# Patient Record
Sex: Female | Born: 1980 | Race: White | Hispanic: No | Marital: Single | Smoking: Former smoker
Health system: Southern US, Community
[De-identification: ages and names within clinical notes are randomized; demographics above are authoritative.]

## PROBLEM LIST (undated history)

## (undated) DIAGNOSIS — O4290 Premature rupture of membranes, unspecified as to length of time between rupture and onset of labor, unspecified weeks of gestation: Secondary | ICD-10-CM

## (undated) DIAGNOSIS — F319 Bipolar disorder, unspecified: Secondary | ICD-10-CM

## (undated) DIAGNOSIS — F329 Major depressive disorder, single episode, unspecified: Secondary | ICD-10-CM

## (undated) DIAGNOSIS — F32A Depression, unspecified: Secondary | ICD-10-CM

## (undated) DIAGNOSIS — K8020 Calculus of gallbladder without cholecystitis without obstruction: Secondary | ICD-10-CM

## (undated) HISTORY — PX: UPPER GI ENDOSCOPY: SHX6162

## (undated) HISTORY — DX: Bipolar disorder, unspecified: F31.9

## (undated) HISTORY — DX: Major depressive disorder, single episode, unspecified: F32.9

## (undated) HISTORY — DX: Depression, unspecified: F32.A

---
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
## 2000-07-03 ENCOUNTER — Other Ambulatory Visit: Admission: RE | Admit: 2000-07-03 | Discharge: 2000-07-03 | Payer: Self-pay | Admitting: Gynecology

## 2001-07-11 ENCOUNTER — Other Ambulatory Visit: Admission: RE | Admit: 2001-07-11 | Discharge: 2001-07-11 | Payer: Self-pay | Admitting: Gynecology

## 2002-10-07 ENCOUNTER — Other Ambulatory Visit: Admission: RE | Admit: 2002-10-07 | Discharge: 2002-10-07 | Payer: Self-pay | Admitting: Gynecology

## 2004-03-01 ENCOUNTER — Other Ambulatory Visit: Admission: RE | Admit: 2004-03-01 | Discharge: 2004-03-01 | Payer: Self-pay | Admitting: Gynecology

## 2005-03-15 ENCOUNTER — Other Ambulatory Visit: Admission: RE | Admit: 2005-03-15 | Discharge: 2005-03-15 | Payer: Self-pay | Admitting: Gynecology

## 2006-04-05 ENCOUNTER — Other Ambulatory Visit: Admission: RE | Admit: 2006-04-05 | Discharge: 2006-04-05 | Payer: Self-pay | Admitting: Gynecology

## 2009-08-06 ENCOUNTER — Emergency Department (HOSPITAL_COMMUNITY): Admission: EM | Admit: 2009-08-06 | Discharge: 2009-08-07 | Payer: Self-pay | Admitting: Emergency Medicine

## 2009-08-07 ENCOUNTER — Ambulatory Visit: Payer: Self-pay | Admitting: Psychiatry

## 2009-08-07 ENCOUNTER — Inpatient Hospital Stay (HOSPITAL_COMMUNITY): Admission: AD | Admit: 2009-08-07 | Discharge: 2009-08-18 | Payer: Self-pay | Admitting: Psychiatry

## 2010-04-10 LAB — HEPATIC FUNCTION PANEL
Albumin: 4.1 g/dL (ref 3.5–5.2)
Alkaline Phosphatase: 59 U/L (ref 39–117)
Indirect Bilirubin: 0.3 mg/dL (ref 0.3–0.9)
Total Protein: 6.7 g/dL (ref 6.0–8.3)

## 2010-04-10 LAB — CARBAMAZEPINE, FREE AND TOTAL
Carbamazepine Metabolite/: 3.3 ug/mL
Carbamazepine Metabolite: 2.4 ug/mL — ABNORMAL HIGH (ref 0.1–1.0)
Carbamazepine, Bound: 6.2 ug/mL
Carbamazepine, Free: 1.7 ug/mL (ref 1.0–3.0)

## 2010-04-10 LAB — CARBAMAZEPINE LEVEL, TOTAL: Carbamazepine Lvl: 12.9 ug/mL — ABNORMAL HIGH (ref 4.0–12.0)

## 2010-04-10 LAB — LIPID PANEL
HDL: 50 mg/dL (ref >39)
Triglycerides: 105 mg/dL (ref <150)
VLDL: 21 mg/dL (ref 0–40)

## 2010-04-11 LAB — DIFFERENTIAL
Basophils Relative: 3 % — ABNORMAL HIGH (ref 0–1)
Eosinophils Absolute: 0.1 10*3/uL (ref 0.0–0.7)
Lymphs Abs: 2.6 10*3/uL (ref 0.7–4.0)
Monocytes Absolute: 0.9 10*3/uL (ref 0.1–1.0)
Monocytes Relative: 10 % (ref 3–12)
Neutrophils Relative %: 57 % (ref 43–77)

## 2010-04-11 LAB — POCT I-STAT, CHEM 8
BUN: 11 mg/dL (ref 6–23)
Creatinine, Ser: 0.8 mg/dL (ref 0.4–1.2)
Glucose, Bld: 95 mg/dL (ref 70–99)
Sodium: 140 mEq/L (ref 135–145)
TCO2: 25 mmol/L (ref 0–100)

## 2010-04-11 LAB — CBC
HCT: 41 % (ref 36.0–46.0)
Hemoglobin: 14.1 g/dL (ref 12.0–15.0)
MCH: 33.8 pg (ref 26.0–34.0)
MCHC: 34.5 g/dL (ref 30.0–36.0)
MCV: 98 fL (ref 78.0–100.0)
RBC: 4.19 MIL/uL (ref 3.87–5.11)

## 2010-04-11 LAB — RAPID URINE DRUG SCREEN, HOSP PERFORMED
Barbiturates: NOT DETECTED
Benzodiazepines: POSITIVE — AB
Cocaine: NOT DETECTED
Opiates: NOT DETECTED

## 2013-01-24 NOTE — L&D Delivery Note (Signed)
Delivery Note At 2:47 PM a viable and healthy female was delivered via Vaginal, Spontaneous Delivery (Presentation: OA, LOT ).  APGAR:8,9 ; weight P  .   Placenta status: delivered intact.  Cord: 3VC with the following complications: nuchal.   Anesthesia: Epidural  Episiotomy: none Lacerations: 1st degree perineal and r labial Suture Repair: 3.0 vicryl rapide Est. Blood Loss (mL): 450cc, some atomy - bimanual massage and cytotec 800mcg PR  Mom to postpartum.  Baby to Couplet care / Skin to Skin.  Bovard-Stuckert, Jody 07/17/2013, 3:05 PM  Br/A+/RI/Mirena for contra

## 2013-02-20 LAB — OB RESULTS CONSOLE ANTIBODY SCREEN: ANTIBODY SCREEN: NEGATIVE

## 2013-02-20 LAB — OB RESULTS CONSOLE ABO/RH: RH TYPE: POSITIVE

## 2013-02-20 LAB — OB RESULTS CONSOLE RUBELLA ANTIBODY, IGM: RUBELLA: IMMUNE

## 2013-02-20 LAB — OB RESULTS CONSOLE HIV ANTIBODY (ROUTINE TESTING): HIV: NONREACTIVE

## 2013-02-20 LAB — OB RESULTS CONSOLE GC/CHLAMYDIA
Chlamydia: NEGATIVE
Gonorrhea: NEGATIVE

## 2013-02-20 LAB — OB RESULTS CONSOLE RPR: RPR: NONREACTIVE

## 2013-02-20 LAB — OB RESULTS CONSOLE HEPATITIS B SURFACE ANTIGEN: HEP B S AG: NEGATIVE

## 2013-05-22 ENCOUNTER — Telehealth: Payer: Self-pay | Admitting: Internal Medicine

## 2013-05-22 NOTE — Telephone Encounter (Signed)
S/W PATIENT AND GVE NP APPT FOR 05/05 @ 10:30 W/DR. CHISM REFERRING DR. TODD MEISINGER DX- WBC ELEVATED WELCOME PACKET MAILED.

## 2013-05-22 NOTE — Telephone Encounter (Signed)
C/D 05/22/13 for appt. 05/28/13

## 2013-05-28 ENCOUNTER — Encounter: Payer: Self-pay | Admitting: Internal Medicine

## 2013-05-28 ENCOUNTER — Ambulatory Visit: Payer: BC Managed Care – PPO

## 2013-05-28 ENCOUNTER — Telehealth: Payer: Self-pay | Admitting: Internal Medicine

## 2013-05-28 ENCOUNTER — Other Ambulatory Visit: Payer: Self-pay | Admitting: Internal Medicine

## 2013-05-28 ENCOUNTER — Encounter (INDEPENDENT_AMBULATORY_CARE_PROVIDER_SITE_OTHER): Payer: Self-pay

## 2013-05-28 ENCOUNTER — Other Ambulatory Visit (HOSPITAL_BASED_OUTPATIENT_CLINIC_OR_DEPARTMENT_OTHER): Payer: BC Managed Care – PPO

## 2013-05-28 ENCOUNTER — Ambulatory Visit (HOSPITAL_BASED_OUTPATIENT_CLINIC_OR_DEPARTMENT_OTHER): Payer: BC Managed Care – PPO | Admitting: Internal Medicine

## 2013-05-28 VITALS — BP 121/75 | HR 97 | Temp 98.3°F | Resp 18 | Wt 167.3 lb

## 2013-05-28 DIAGNOSIS — D72829 Elevated white blood cell count, unspecified: Secondary | ICD-10-CM

## 2013-05-28 DIAGNOSIS — F17200 Nicotine dependence, unspecified, uncomplicated: Secondary | ICD-10-CM

## 2013-05-28 DIAGNOSIS — Z331 Pregnant state, incidental: Secondary | ICD-10-CM

## 2013-05-28 DIAGNOSIS — D649 Anemia, unspecified: Secondary | ICD-10-CM

## 2013-05-28 DIAGNOSIS — E876 Hypokalemia: Secondary | ICD-10-CM

## 2013-05-28 LAB — CBC WITH DIFFERENTIAL/PLATELET
BASO%: 0.2 % (ref 0.0–2.0)
Basophils Absolute: 0 10*3/uL (ref 0.0–0.1)
EOS%: 0.9 % (ref 0.0–7.0)
Eosinophils Absolute: 0.2 10*3/uL (ref 0.0–0.5)
HEMATOCRIT: 33.6 % — AB (ref 34.8–46.6)
HGB: 11.2 g/dL — ABNORMAL LOW (ref 11.6–15.9)
LYMPH#: 2.4 10*3/uL (ref 0.9–3.3)
LYMPH%: 12.9 % — ABNORMAL LOW (ref 14.0–49.7)
MCH: 30.8 pg (ref 25.1–34.0)
MCHC: 33.3 g/dL (ref 31.5–36.0)
MCV: 92.3 fL (ref 79.5–101.0)
MONO#: 1.5 10*3/uL — AB (ref 0.1–0.9)
MONO%: 8 % (ref 0.0–14.0)
NEUT#: 14.5 10*3/uL — ABNORMAL HIGH (ref 1.5–6.5)
NEUT%: 78 % — AB (ref 38.4–76.8)
Platelets: 322 10*3/uL (ref 145–400)
RBC: 3.64 10*6/uL — ABNORMAL LOW (ref 3.70–5.45)
RDW: 13.6 % (ref 11.2–14.5)
WBC: 18.5 10*3/uL — AB (ref 3.9–10.3)

## 2013-05-28 LAB — COMPREHENSIVE METABOLIC PANEL (CC13)
ALBUMIN: 2.5 g/dL — AB (ref 3.5–5.0)
ALK PHOS: 119 U/L (ref 40–150)
ALT: 9 U/L (ref 0–55)
AST: 11 U/L (ref 5–34)
Anion Gap: 8 mEq/L (ref 3–11)
BUN: 3.9 mg/dL — AB (ref 7.0–26.0)
CALCIUM: 9.7 mg/dL (ref 8.4–10.4)
CHLORIDE: 112 meq/L — AB (ref 98–109)
CO2: 21 mEq/L — ABNORMAL LOW (ref 22–29)
Creatinine: 0.6 mg/dL (ref 0.6–1.1)
Glucose: 109 mg/dl (ref 70–140)
POTASSIUM: 3.4 meq/L — AB (ref 3.5–5.1)
SODIUM: 141 meq/L (ref 136–145)
TOTAL PROTEIN: 5.7 g/dL — AB (ref 6.4–8.3)
Total Bilirubin: <0.2 mg/dL (ref 0.20–1.20)

## 2013-05-28 LAB — CHCC SMEAR

## 2013-05-28 LAB — LACTATE DEHYDROGENASE (CC13): LDH: 159 U/L (ref 125–245)

## 2013-05-28 NOTE — Progress Notes (Signed)
Checked in new patient with no financial issues. She has not been out of country and didn't have her ncdl or id. Will bring next time. She said was denied medicaid.

## 2013-05-28 NOTE — Telephone Encounter (Signed)
gave pt appt for labs for june and July 2015 lab and MD

## 2013-05-28 NOTE — Progress Notes (Signed)
[facility] [phone]   [phone]  NEW PATIENT EVALUATION   Name: Stephanie Schroeder Date: 05/29/2013 MRN: [id number] DOB: 07/16/1980  PCP: No PCP Per Patient   REFERRING PHYSICIAN: Meisinger, Todd, MD  REASON FOR REFERRAL: Leukocytosis NOS   HISTORY OF PRESENT ILLNESS:Stephanie Schroeder is a 33 y.o. female with a history of bipolar disorder, depressive disorder who is [redacted] weeks gestation is being referred for further evaluation of her leukocytosis by Dr. Lavina Hammanodd Meisinger.  This is her first pregnancy.  She reports good prenatal visits (monthly).  She notes persistence in her elevated WBCs. Her CBC on 05/16/2013 revealed a WBC of 23.6 with elevated neutrophils (absolute) of 18.8; lymphs of 3.3 and monocytes of 1.2.  Her hemoglobin was 11.1 and hematocrit was 32.5 .    In August 06, 2009, she was [facility] her WBC of 9.2.  They first checked her complete blood count in December.  She reported being told she had an dental infection in January 2015 by Dr. Beckie SaltsHobbs Turner office. She notes that she will require root canal therapy.  She reports a history of wisdom tooth on the left upper and lower with removal in November, 2014.  She reported being treated for a dental infections following these extractions.  She denies any steroid use.   She denies any new medications.  She remains of Stephanie Schroeder daily Since her second trimester.  She was on it previously but took herself off this medication a couple years ago. She denies fevers or chills.  She denies urinary symptoms such as burning with urination.  She is compliant with prenatal vitamins.  Her estimated date of delivery  Is July 3rd 2015.  She denies bleeding problems or blood transfusions.  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  Her remaining paps have been normal.  Her weight started at 133 lbs and she weighs 167 today. She quit smoking for the most part with  occasional cigarettes doing pregnancy.    PAST MEDICAL HISTORY:  has a past medical history of Bipolar 1 disorder and Depression.     PAST SURGICAL HISTORY:No past surgical history on file.   CURRENT MEDICATIONS: has a current medication list which includes the following prescription(s): ondansetron, prenatal vit-fe fumarate-fa, and venlafaxine xr.   ALLERGIES: Risperidone and related   SOCIAL HISTORY:  reports that she has been smoking.  She does not have any smokeless tobacco history on file. She reports that she does not use illicit drugs.   FAMILY HISTORY: She denies a family history of hematological problems.   LABORATORY DATA:  Results for orders placed in visit on 05/28/13 (from the past 48 hour(s))  CBC WITH DIFFERENTIAL     Status: Abnormal   Collection Time    05/28/13 10:38 AM      Result Value Ref Range   WBC 18.5 (*) 3.9 - 10.3 10e3/uL   NEUT# 14.5 (*) 1.5 - 6.5 10e3/uL   HGB 11.2 (*) 11.6 - 15.9 g/dL   HCT 29.533.6 (*) 62.134.8 - 30.846.6 %   Platelets 322  145 - 400 10e3/uL   MCV 92.3  79.5 - 101.0 fL   MCH 30.8  25.1 - 34.0 pg   MCHC 33.3  31.5 - 36.0 g/dL   RBC 6.573.64 (*) 8.463.70 - 9.625.45 10e6/uL   RDW 13.6  11.2 - 14.5 %   lymph# 2.4  0.9 - 3.3 10e3/uL  MONO# 1.5 (*) 0.1 - 0.9 10e3/uL   Eosinophils Absolute 0.2  0.0 - 0.5 10e3/uL   Basophils Absolute 0.0  0.0 - 0.1 10e3/uL   NEUT% 78.0 (*) 38.4 - 76.8 %   LYMPH% 12.9 (*) 14.0 - 49.7 %   MONO% 8.0  0.0 - 14.0 %   EOS% 0.9  0.0 - 7.0 %   BASO% 0.2  0.0 - 2.0 %  [facility] SMEAR     Status: None   Collection Time    05/28/13 10:38 AM      Result Value Ref Range   Smear Result Smear Available    LACTATE DEHYDROGENASE (CC13)     Status: None   Collection Time    05/28/13 10:38 AM      Result Value Ref Range   LDH 159  125 - 245 U/L  COMPREHENSIVE METABOLIC PANEL (CC13)     Status: Abnormal   Collection Time    05/28/13 10:38 AM      Result Value Ref Range   Sodium 141  136 - 145 mEq/L   Potassium 3.4 (*) 3.5 - 5.1  mEq/L   Chloride 112 (*) 98 - 109 mEq/L   CO2 21 (*) 22 - 29 mEq/L   Glucose 109  70 - 140 Schroeder/dl   BUN 3.9 (*) 7.0 - 16.1 Schroeder/dL   Creatinine 0.6  0.6 - 1.1 Schroeder/dL   Total Bilirubin <0.96  0.20 - 1.20 Schroeder/dL   Alkaline Phosphatase 119  40 - 150 U/L   AST 11  5 - 34 U/L   ALT 9  0 - 55 U/L   Total Protein 5.7 (*) 6.4 - 8.3 g/dL   Albumin 2.5 (*) 3.5 - 5.0 g/dL   Calcium 9.7  8.4 - 04.5 Schroeder/dL   Anion Gap 8  3 - 11 mEq/L       RADIOGRAPHY: No results found.     REVIEW OF SYSTEMS:  Constitutional: Denies fevers, chills or abnormal weight loss Eyes: Denies blurriness of vision Ears, nose, mouth, throat, and face: Denies mucositis or sore throat Respiratory: Denies cough, dyspnea or wheezes Cardiovascular: Denies palpitation, chest discomfort or lower extremity swelling Gastrointestinal:  Denies nausea, heartburn or change in bowel habits Skin: Denies abnormal skin rashes Lymphatics: Denies new lymphadenopathy or easy bruising Neurological:Denies numbness, tingling or new weaknesses Behavioral/Psych: Mood is stable, no new changes  All other systems were reviewed with the patient and are negative.  PHYSICAL EXAM:  weight is 167 lb 4.8 oz (75.887 kg). Her oral temperature is 98.3 F (36.8 C). Her blood pressure is 121/75 and her pulse is 97. Her respiration is 18.    GENERAL:alert, no distress and comfortable; well developed and well nourished.  SKIN: skin color, texture, turgor are normal, no rashes or significant lesions EYES: normal, Conjunctiva are pink and non-injected, sclera clear OROPHARYNX:no exudate, no erythema and lips, buccal mucosa, and tongue normal  NECK: supple, thyroid normal size, non-tender, without nodularity LYMPH:  no palpable lymphadenopathy in the cervical, axillary or inguinal LUNGS: clear to auscultation and percussion with normal breathing effort HEART: regular rate & rhythm and no murmurs and no lower extremity edema ABDOMEN:abdomen soft, non-tender and  normal bowel sounds; gestation about 31 weeks.  Musculoskeletal:no cyanosis of digits and no clubbing; L arm brace for carpal tunnel.  NEURO: alert & oriented x 3 with fluent speech, no focal motor/sensory deficits   IMPRESSION: Yasmyn Bellisario is a 33 y.o. female with a history of  PLAN: 1.  Leukocytosis NOS. --We discussed common causes of elevated wbcs including infection, inflammation, medication-induced, i.e., steroids, versus smoking and pregnancy.  She denies fevers or receipt of antibiotics suggestive of infectious etiology.  She denies cough, dysuria.   She reports a normal UA recently from her primary care physician's office. She does have carpal tunnel that is mild.  She denies steroid use presently.  Therefore, pregnancy should be considered.   It has been associated with leukocytosis, primarily related to increased circulation of neutrophils.  Her neutrophil count is elevated as noted above.   The neutrophil count begins to increase in the second month of pregnancy and plateaus in the second or third trimester, at which time the total white blood cell counts range from 9000 to 15,000 cells/microL.  The white blood cell count falls to the normal nonpregnant range by the sixth day postpartum.  We will examine her smear.  We will repeat her CBC in one month to establish trend and one month post-partum.  If it remains elevated, we will send for additional testing. We provided a handout on leukocytosis.   2. Tobacco abuse. --We strongly advised again any smoking while pregnant.  She voiced understanding. Smoking, especially heavy smoking, can cause mild elevation in leukocytes as well.   3. Mild anemia, normocytic.  --Her anemia appears to be normocytic and likely secondary to pregnancy.  We will repeat labs in one month and counseled her to continue her prenatal vitamins.  4. Pregnancy. --She is at [redacted] weeks gestation with EDD in July.   This is here first pregnancy.   5. Hypokalemia.    --Potassium 3.4 today.  We counseled the patient to increase potassium enriched foods.  She was asymptomatic for symptoms of hypokalemia.   6. Follow-up. --Labs in one month and follow up visit several weeks post-delivery.   All questions were answered. The patient knows to call the clinic with any problems, questions or concerns. We can certainly see the patient much sooner if necessary.  I spent 30 minutes counseling the patient face to face. The total time spent in the appointment was 45 minutes.    Myra Rudeavid Bryella Diviney, MD 05/29/2013 9:22 AM

## 2013-05-28 NOTE — Progress Notes (Signed)
Patient didn't know she had medicaid. I verified via passport.

## 2013-05-28 NOTE — Patient Instructions (Signed)
Leukocytosis Leukocytosis means you have more white blood cells than normal. White blood cells are made in your bone marrow. The main job of white blood cells is to fight infection. Having too many white blood cells is a common condition. It can develop as a result of many types of medical problems. CAUSES  In some cases, your bone marrow may be normal, but it is still making too many white blood cells. This could be the result of:  Infection.  Injury.  Physical stress.  Emotional stress.  Surgery.  Allergic reactions.  Tumors that do not start in the blood or bone marrow.  An inherited disease.  Certain medicines.  Pregnancy and labor. In other cases, you may have a bone marrow disorder that is causing your body to make too many white blood cells. Bone marrow disorders include:  Leukemia. This is a type of blood cancer.  Myeloproliferative disorders. These disorders cause blood cells to grow abnormally. SYMPTOMS  Some people have no symptoms. Others have symptoms due to the medical problem that is causing their leukocytosis. These symptoms may include:  Bleeding.  Bruising.  Fever.  Night sweats.  Repeated infections.  Weakness.  Weight loss. DIAGNOSIS  Leukocytosis is often found during blood tests that are done as part of a normal physical exam. Your caregiver will probably order other tests to help determine why you have too many white blood cells. These tests may include:  A complete blood count (CBC). This test measures all the types of blood cells in your body.  Chest X-rays, urine tests (urinalysis), or other tests to look for signs of infection.  Bone marrow aspiration. For this test, a needle is put into your bone. Cells from the bone marrow are removed through the needle. The cells are then examined under a microscope. TREATMENT  Treatment is usually not needed for leukocytosis. However, if a disorder is causing your leukocytosis, it will need to be  treated. Treatment may include:  Antibiotic medicines if you have a bacterial infection.  Bone marrow transplant. Your diseased bone marrow is replaced with healthy cells that will grow new bone marrow.  Chemotherapy. This is the use of drugs to kill cancer cells. HOME CARE INSTRUCTIONS  Only take over-the-counter or prescription medicines as directed by your caregiver.  Maintain a healthy weight. Ask your caregiver what weight is best for you.  Eat foods that are low in saturated fats and high in fiber. Eat plenty of fruits and vegetables.  Drink enough fluids to keep your urine clear or pale yellow.  Get 30 minutes of exercise at least 5 times a week. Check with your caregiver before starting a new exercise routine.  Limit caffeine and alcohol.  Do not smoke.  Keep all follow-up appointments as directed by your caregiver. SEEK MEDICAL CARE IF:  You feel weak or more tired than usual.  You develop chills, a cough, or nasal congestion.  You lose weight without trying.  You have night sweats.  You bruise easily. SEEK IMMEDIATE MEDICAL CARE IF:  You bleed more than normal.  You have chest pain.  You have trouble breathing.  You have a fever.  You have uncontrolled nausea or vomiting.  You feel dizzy or lightheaded. MAKE SURE YOU:  Understand these instructions.  Will watch your condition.  Will get help right away if you are not doing well or get worse. Document Released: 12/30/2010 Document Revised: 04/04/2011 Document Reviewed: 12/30/2010 [employer] Patient Information 2014 [employer].  

## 2013-05-29 ENCOUNTER — Encounter: Payer: Self-pay | Admitting: Internal Medicine

## 2013-06-27 LAB — OB RESULTS CONSOLE GBS: GBS: NEGATIVE

## 2013-06-28 ENCOUNTER — Other Ambulatory Visit: Payer: BC Managed Care – PPO

## 2013-07-17 ENCOUNTER — Inpatient Hospital Stay (HOSPITAL_COMMUNITY)
Admission: AD | Admit: 2013-07-17 | Discharge: 2013-07-19 | DRG: 775 | Disposition: A | Discharge status: Home / Self Care | Payer: BC Managed Care – PPO | Source: Ambulatory Visit | Attending: Obstetrics and Gynecology | Admitting: Obstetrics and Gynecology

## 2013-07-17 ENCOUNTER — Encounter (HOSPITAL_COMMUNITY): Payer: Self-pay | Admitting: *Deleted

## 2013-07-17 ENCOUNTER — Inpatient Hospital Stay (HOSPITAL_COMMUNITY): Payer: BC Managed Care – PPO | Admitting: Anesthesiology

## 2013-07-17 ENCOUNTER — Encounter (HOSPITAL_COMMUNITY): Payer: BC Managed Care – PPO | Admitting: Anesthesiology

## 2013-07-17 DIAGNOSIS — F341 Dysthymic disorder: Secondary | ICD-10-CM | POA: Diagnosis present

## 2013-07-17 DIAGNOSIS — F319 Bipolar disorder, unspecified: Secondary | ICD-10-CM | POA: Diagnosis present

## 2013-07-17 DIAGNOSIS — O99891 Other specified diseases and conditions complicating pregnancy: Secondary | ICD-10-CM | POA: Diagnosis present

## 2013-07-17 DIAGNOSIS — IMO0001 Reserved for inherently not codable concepts without codable children: Secondary | ICD-10-CM

## 2013-07-17 DIAGNOSIS — O99344 Other mental disorders complicating childbirth: Secondary | ICD-10-CM | POA: Diagnosis present

## 2013-07-17 DIAGNOSIS — O4290 Premature rupture of membranes, unspecified as to length of time between rupture and onset of labor, unspecified weeks of gestation: Secondary | ICD-10-CM

## 2013-07-17 DIAGNOSIS — O99334 Smoking (tobacco) complicating childbirth: Secondary | ICD-10-CM | POA: Diagnosis present

## 2013-07-17 HISTORY — DX: Premature rupture of membranes, unspecified as to length of time between rupture and onset of labor, unspecified weeks of gestation: O42.90

## 2013-07-17 LAB — CBC
HCT: 38 % (ref 36.0–46.0)
Hemoglobin: 13 g/dL (ref 12.0–15.0)
MCH: 31.9 pg (ref 26.0–34.0)
MCHC: 34.2 g/dL (ref 30.0–36.0)
MCV: 93.1 fL (ref 78.0–100.0)
Platelets: 286 10*3/uL (ref 150–400)
RBC: 4.08 MIL/uL (ref 3.87–5.11)
RDW: 14.9 % (ref 11.5–15.5)
WBC: 16.4 10*3/uL — AB (ref 4.0–10.5)

## 2013-07-17 LAB — RPR

## 2013-07-17 MED ORDER — SIMETHICONE 80 MG PO CHEW: 80.0000 mg | CHEWABLE_TABLET | ORAL | Status: DC | PRN

## 2013-07-17 MED ORDER — LIDOCAINE HCL (PF) 1 % IJ SOLN
INTRAMUSCULAR | Status: DC | PRN
  Administered 2013-07-17 (×2): 5 mL

## 2013-07-17 MED ORDER — IBUPROFEN 600 MG PO TABS
600.0000 mg | ORAL_TABLET | Freq: Four times a day (QID) | ORAL | Status: DC
  Administered 2013-07-17 – 2013-07-19 (×8): 600 mg via ORAL
  Filled 2013-07-17 (×8): qty 1

## 2013-07-17 MED ORDER — ONDANSETRON HCL 4 MG/2ML IJ SOLN
4.0000 mg | Freq: Four times a day (QID) | INTRAMUSCULAR | Status: DC | PRN
  Administered 2013-07-17: 4 mg via INTRAVENOUS
  Filled 2013-07-17: qty 2

## 2013-07-17 MED ORDER — BENZOCAINE-MENTHOL 20-0.5 % EX AERO
1.0000 "application " | INHALATION_SPRAY | CUTANEOUS | Status: DC | PRN
  Administered 2013-07-17: 1 via TOPICAL
  Filled 2013-07-17: qty 56

## 2013-07-17 MED ORDER — ONDANSETRON HCL 4 MG PO TABS: 4.0000 mg | ORAL_TABLET | Freq: Three times a day (TID) | ORAL | Status: DC | PRN

## 2013-07-17 MED ORDER — PHENYLEPHRINE 40 MCG/ML (10ML) SYRINGE FOR IV PUSH (FOR BLOOD PRESSURE SUPPORT)
80.0000 ug | PREFILLED_SYRINGE | INTRAVENOUS | Status: DC | PRN
  Filled 2013-07-17: qty 2

## 2013-07-17 MED ORDER — DIPHENHYDRAMINE HCL 50 MG/ML IJ SOLN: 12.5000 mg | INTRAMUSCULAR | Status: DC | PRN

## 2013-07-17 MED ORDER — EPHEDRINE 5 MG/ML INJ
10.0000 mg | INTRAVENOUS | Status: DC | PRN
  Filled 2013-07-17: qty 2
  Filled 2013-07-17: qty 4

## 2013-07-17 MED ORDER — DIBUCAINE 1 % RE OINT: 1.0000 "application " | TOPICAL_OINTMENT | RECTAL | Status: DC | PRN

## 2013-07-17 MED ORDER — ACETAMINOPHEN 325 MG PO TABS: 650.0000 mg | ORAL_TABLET | ORAL | Status: DC | PRN

## 2013-07-17 MED ORDER — BUTORPHANOL TARTRATE 1 MG/ML IJ SOLN: 1.0000 mg | INTRAMUSCULAR | Status: DC | PRN

## 2013-07-17 MED ORDER — LACTATED RINGERS IV SOLN: INTRAVENOUS | Status: DC

## 2013-07-17 MED ORDER — ZOLPIDEM TARTRATE 5 MG PO TABS: 5.0000 mg | ORAL_TABLET | Freq: Every evening | ORAL | Status: DC | PRN

## 2013-07-17 MED ORDER — DIPHENHYDRAMINE HCL 25 MG PO CAPS: 25.0000 mg | ORAL_CAPSULE | Freq: Four times a day (QID) | ORAL | Status: DC | PRN

## 2013-07-17 MED ORDER — EPHEDRINE 5 MG/ML INJ
10.0000 mg | INTRAVENOUS | Status: DC | PRN
  Filled 2013-07-17: qty 2

## 2013-07-17 MED ORDER — PRENATAL MULTIVITAMIN CH
1.0000 | ORAL_TABLET | Freq: Every day | ORAL | Status: DC
  Administered 2013-07-18 – 2013-07-19 (×2): 1 via ORAL
  Filled 2013-07-17 (×2): qty 1

## 2013-07-17 MED ORDER — VENLAFAXINE HCL ER 75 MG PO CP24
75.0000 mg | ORAL_CAPSULE | Freq: Every day | ORAL | Status: DC
  Administered 2013-07-17 – 2013-07-18 (×2): 75 mg via ORAL
  Filled 2013-07-17 (×3): qty 1

## 2013-07-17 MED ORDER — FLEET ENEMA 7-19 GM/118ML RE ENEM: 1.0000 | ENEMA | RECTAL | Status: DC | PRN

## 2013-07-17 MED ORDER — TERBUTALINE SULFATE 1 MG/ML IJ SOLN: 0.2500 mg | Freq: Once | INTRAMUSCULAR | Status: DC | PRN

## 2013-07-17 MED ORDER — LIDOCAINE HCL (PF) 1 % IJ SOLN
30.0000 mL | INTRAMUSCULAR | Status: DC | PRN
  Filled 2013-07-17: qty 30

## 2013-07-17 MED ORDER — LACTATED RINGERS IV SOLN
500.0000 mL | Freq: Once | INTRAVENOUS | Status: AC
  Administered 2013-07-17: 500 mL via INTRAVENOUS

## 2013-07-17 MED ORDER — OXYCODONE-ACETAMINOPHEN 5-325 MG PO TABS
1.0000 | ORAL_TABLET | ORAL | Status: DC | PRN
  Administered 2013-07-18 – 2013-07-19 (×4): 1 via ORAL
  Filled 2013-07-17 (×4): qty 1

## 2013-07-17 MED ORDER — LACTATED RINGERS IV SOLN: 500.0000 mL | INTRAVENOUS | Status: DC | PRN

## 2013-07-17 MED ORDER — ONDANSETRON HCL 4 MG/2ML IJ SOLN
4.0000 mg | INTRAMUSCULAR | Status: DC | PRN
Start: 2013-07-17 — End: 2013-07-19

## 2013-07-17 MED ORDER — OXYTOCIN 40 UNITS IN LACTATED RINGERS INFUSION - SIMPLE MED: 1.0000 m[IU]/min | INTRAVENOUS | Status: DC

## 2013-07-17 MED ORDER — OXYTOCIN 40 UNITS IN LACTATED RINGERS INFUSION - SIMPLE MED
62.5000 mL/h | INTRAVENOUS | Status: DC
  Administered 2013-07-17: 62.5 mL/h via INTRAVENOUS
  Filled 2013-07-17: qty 1000

## 2013-07-17 MED ORDER — OXYCODONE-ACETAMINOPHEN 5-325 MG PO TABS: 1.0000 | ORAL_TABLET | ORAL | Status: DC | PRN

## 2013-07-17 MED ORDER — MISOPROSTOL 200 MCG PO TABS
ORAL_TABLET | ORAL | Status: AC
  Filled 2013-07-17: qty 4

## 2013-07-17 MED ORDER — ONDANSETRON HCL 4 MG PO TABS: 4.0000 mg | ORAL_TABLET | ORAL | Status: DC | PRN

## 2013-07-17 MED ORDER — TETANUS-DIPHTH-ACELL PERTUSSIS 5-2.5-18.5 LF-MCG/0.5 IM SUSP: 0.5000 mL | Freq: Once | INTRAMUSCULAR | Status: DC

## 2013-07-17 MED ORDER — SENNOSIDES-DOCUSATE SODIUM 8.6-50 MG PO TABS
2.0000 | ORAL_TABLET | ORAL | Status: DC
  Administered 2013-07-17 – 2013-07-19 (×2): 2 via ORAL
  Filled 2013-07-17 (×2): qty 2

## 2013-07-17 MED ORDER — LANOLIN HYDROUS EX OINT: TOPICAL_OINTMENT | CUTANEOUS | Status: DC | PRN

## 2013-07-17 MED ORDER — MISOPROSTOL 200 MCG PO TABS
800.0000 ug | ORAL_TABLET | Freq: Once | ORAL | Status: AC
  Administered 2013-07-17: 800 ug via RECTAL

## 2013-07-17 MED ORDER — CITRIC ACID-SODIUM CITRATE 334-500 MG/5ML PO SOLN: 30.0000 mL | ORAL | Status: DC | PRN

## 2013-07-17 MED ORDER — OXYTOCIN BOLUS FROM INFUSION: 500.0000 mL | INTRAVENOUS | Status: DC

## 2013-07-17 MED ORDER — WITCH HAZEL-GLYCERIN EX PADS: 1.0000 "application " | MEDICATED_PAD | CUTANEOUS | Status: DC | PRN

## 2013-07-17 MED ORDER — IBUPROFEN 600 MG PO TABS: 600.0000 mg | ORAL_TABLET | Freq: Four times a day (QID) | ORAL | Status: DC | PRN

## 2013-07-17 MED ORDER — PHENYLEPHRINE 40 MCG/ML (10ML) SYRINGE FOR IV PUSH (FOR BLOOD PRESSURE SUPPORT)
80.0000 ug | PREFILLED_SYRINGE | INTRAVENOUS | Status: DC | PRN
  Filled 2013-07-17: qty 10
  Filled 2013-07-17: qty 2

## 2013-07-17 MED ORDER — FENTANYL 2.5 MCG/ML BUPIVACAINE 1/10 % EPIDURAL INFUSION (WH - ANES)
14.0000 mL/h | INTRAMUSCULAR | Status: DC | PRN
  Administered 2013-07-17: 14 mL/h via EPIDURAL
  Filled 2013-07-17: qty 125

## 2013-07-17 NOTE — MAU Note (Signed)
C/o SROM @ 0600;

## 2013-07-17 NOTE — Progress Notes (Signed)
Patient ID: Stephanie EmeryRebecca Schroeder, female   DOB: 05/30/1980, [age] y.o.   MRN: [id number]  Comfortable with epidural, no c/o's.  AFVSS gen NAD FHTs 120's, category 1 toco Q2-493min  SVE 8/100/+1 per RN  Continue current management Likely SVD Expect Complete and Pushing soon.

## 2013-07-17 NOTE — Anesthesia Preprocedure Evaluation (Signed)
Anesthesia Evaluation  Patient identified by MRN, date of birth, ID band Patient awake    Reviewed: Allergy & Precautions, H&P , Patient's Chart, lab work & pertinent test results  Airway Mallampati: II TM Distance: >3 FB Neck ROM: full    Dental   Pulmonary Current Smoker,  breath sounds clear to auscultation        Cardiovascular Rhythm:regular Rate:Normal     Neuro/Psych PSYCHIATRIC DISORDERS Depression Bipolar Disorder    GI/Hepatic   Endo/Other    Renal/GU      Musculoskeletal   Abdominal   Peds  Hematology   Anesthesia Other Findings   Reproductive/Obstetrics (+) Pregnancy                           Anesthesia Physical Anesthesia Plan  ASA: II  Anesthesia Plan: Epidural   Post-op Pain Management:    Induction:   Airway Management Planned:   Additional Equipment:   Intra-op Plan:   Post-operative Plan:   Informed Consent: I have reviewed the patients History and Physical, chart, labs and discussed the procedure including the risks, benefits and alternatives for the proposed anesthesia with the patient or authorized representative who has indicated his/her understanding and acceptance.     Plan Discussed with:   Anesthesia Plan Comments:         Anesthesia Quick Evaluation

## 2013-07-17 NOTE — Anesthesia Procedure Notes (Signed)
Epidural Patient location during procedure: OB Start time: 07/17/2013 9:55 AM  Staffing Anesthesiologist: Brayton CavesJACKSON, Brighten Buzzelli Performed by: anesthesiologist   Preanesthetic Checklist Completed: patient identified, site marked, surgical consent, pre-op evaluation, timeout performed, IV checked, risks and benefits discussed and monitors and equipment checked  Epidural Patient position: sitting Prep: site prepped and draped and DuraPrep Patient monitoring: continuous pulse ox and blood pressure Approach: midline Location: L3-L4 Injection technique: LOR air  Needle:  Needle type: Tuohy  Needle gauge: 17 G Needle length: 9 cm and 9 Needle insertion depth: 5 cm cm Catheter type: closed end flexible Catheter size: 19 Gauge Catheter at skin depth: 10 cm Test dose: negative  Assessment Events: blood not aspirated, injection not painful, no injection resistance, negative IV test and no paresthesia  Additional Notes Patient identified.  Risk benefits discussed including failed block, incomplete pain control, headache, nerve damage, paralysis, blood pressure changes, nausea, vomiting, reactions to medication both toxic or allergic, and postpartum back pain.  Patient expressed understanding and wished to proceed.  All questions were answered.  Sterile technique used throughout procedure and epidural site dressed with sterile barrier dressing. No paresthesia or other complications noted.The patient did not experience any signs of intravascular injection such as tinnitus or metallic taste in mouth nor signs of intrathecal spread such as rapid motor block. Please see nursing notes for vital signs.

## 2013-07-17 NOTE — H&P (Signed)
Wynetta EmeryRebecca Azpeitia is a 33 y.o. female G1P0 at 38+ with ROM.  ROM with light meconium - d/w pt.  Relatively uncomplicated PNC - but h/o depression and anxiety -bipolar-- on Effexor.  +FM, LOF, no VB, occ ctx.  Ctx increasing in intensity and frequency. Late to Care 16 wks Maternal Medical History:  Reason for admission: Rupture of membranes.   Contractions: Frequency: regular.   Perceived severity is strong.    Fetal activity: Perceived fetal activity is normal.    Prenatal Complications - Diabetes: none.    OB History   Grav Para Term Preterm Abortions TAB SAB Ect Mult Living   1             G1 present + abn pap No STDs  Past Medical History  Diagnosis Date  . Bipolar 1 disorder   . Depression   . ROM (rupture of membranes), premature 07/17/2013   Past Surgical History  Procedure Laterality Date  . Upper gi endoscopy     Family History: family history is negative for Alcohol abuse, Arthritis, Asthma, Birth defects, Cancer, COPD, Depression, Diabetes, Drug abuse, Early death, Hearing loss, Heart disease, Hyperlipidemia, Hypertension, Kidney disease, Learning disabilities, Mental illness, Mental retardation, Miscarriages / Stillbirths, Stroke, Vision loss, and Varicose Veins. Social History:  reports that she has been smoking.  She does not have any smokeless tobacco history on file. She reports that she does not drink alcohol or use illicit drugs.bartender Meds PNV, Effexor All Risperdal, hydrocodone   Prenatal Transfer Tool  Maternal Diabetes: No Genetic Screening: Normal Maternal Ultrasounds/Referrals: Normal Fetal Ultrasounds or other Referrals:  None Maternal Substance Abuse:  No Significant Maternal Medications:  Meds include: Other: Effexor Significant Maternal Lab Results:  Lab values include: Group B Strep negative Other Comments:  Late to care  Review of Systems  Constitutional: Negative.   HENT: Negative.   Respiratory: Negative.   Cardiovascular: Negative.    Gastrointestinal: Negative.   Genitourinary: Negative.   Musculoskeletal: Negative.   Skin: Negative.   Neurological: Negative.   Psychiatric/Behavioral: Negative.     Dilation: 3 Effacement (%): 80 Station: -1;0 Exam by:: dr. Ellyn Hackbovard Blood pressure 140/82, pulse 69, temperature 98.1 F (36.7 C), temperature source Oral, resp. rate 18, height 5\' 5"  (1.651 m), weight 79.833 kg (176 lb). Maternal Exam:  Uterine Assessment: Contraction strength is moderate.  Contraction frequency is regular.   Abdomen: Fundal height is appropriate for gestation.   Estimated fetal weight is 7.5-8.5#.   Fetal presentation: vertex  Introitus: Normal vulva. Normal vagina.  Pelvis: adequate for delivery.   Cervix: Cervix evaluated by digital exam.     Physical Exam  Constitutional: She is oriented to person, place, and time. She appears well-developed and well-nourished.  HENT:  Head: Normocephalic and atraumatic.  Cardiovascular: Normal rate and regular rhythm.   Respiratory: Effort normal and breath sounds normal. No respiratory distress. She has no wheezes.  GI: Soft. Bowel sounds are normal. She exhibits no distension. There is no tenderness.  Musculoskeletal: Normal range of motion.  Neurological: She is alert and oriented to person, place, and time.  Skin: Skin is warm and dry.  Psychiatric: She has a normal mood and affect. Her behavior is normal.    Prenatal labs: ABO, Rh: A/Positive/-- (01/28 0000) Antibody: Negative (01/28 0000) Rubella: Immune (01/28 0000) RPR: Nonreactive (01/28 0000)  HBsAg: Negative (01/28 0000)  HIV: Non-reactive (01/28 0000)  GBS: Negative (06/04 0000)   Hgb 11.8/Ur Cx neg/GC neg/ Chl neg/ Pap WNL/Plt 343K/AFP  WNL/ CF neg/glucola 119   US - nl anat, LTC, female, previa  US - good growth, vtx, ant plac, previa resolved, female  Assessment/Plan: 33yo G1P0 with ROM, light meconium Expect SVD Epidural prn Pitocin prn gbbs neg   Bovard-Stuckert,  Jody 07/17/2013, 9:06 AM

## 2013-07-18 LAB — CBC
HCT: 27.2 % — ABNORMAL LOW (ref 36.0–46.0)
Hemoglobin: 9 g/dL — ABNORMAL LOW (ref 12.0–15.0)
MCH: 31.1 pg (ref 26.0–34.0)
MCHC: 33.1 g/dL (ref 30.0–36.0)
MCV: 94.1 fL (ref 78.0–100.0)
PLATELETS: 245 10*3/uL (ref 150–400)
RBC: 2.89 MIL/uL — AB (ref 3.87–5.11)
RDW: 15 % (ref 11.5–15.5)
WBC: 21.4 10*3/uL — AB (ref 4.0–10.5)

## 2013-07-18 NOTE — Anesthesia Postprocedure Evaluation (Signed)
Anesthesia Post Note  Patient: Stephanie Schroeder  Procedure(s) Performed: * No procedures listed *  Anesthesia type: Epidural  Patient location: Mother/Baby  Post pain: Pain level controlled  Post assessment: Post-op Vital signs reviewed  Last Vitals:  Filed Vitals:   07/18/13 0523  BP: 124/81  Pulse: 80  Temp: 36.8 C  Resp: 18    Post vital signs: Reviewed  Level of consciousness:alert  Complications: No apparent anesthesia complications

## 2013-07-18 NOTE — Progress Notes (Signed)
Post Partum Day 1 Subjective: no complaints, up ad lib, voiding, tolerating PO and nl lochia, pain controlled  Objective: Blood pressure 124/81, pulse 80, temperature 98.3 F (36.8 C), temperature source Axillary, resp. rate 18, height 5\' 5"  (1.651 m), weight 79.833 kg (176 lb), SpO2 98.00%, unknown if currently breastfeeding.  Physical Exam:  General: alert and no distress Lochia: appropriate Uterine Fundus: firm    Recent Labs  07/17/13 0825 07/18/13 0545  HGB 13.0 9.0*  HCT 38.0 27.2*    Assessment/Plan: Plan for discharge tomorrow, Breastfeeding and Lactation consult   LOS: 1 day   Stephanie Schroeder, Stephanie Schroeder 07/18/2013, 8:36 AM

## 2013-07-19 MED ORDER — IBUPROFEN 800 MG PO TABS: 800.0000 mg | ORAL_TABLET | Freq: Three times a day (TID) | ORAL | Status: DC | PRN

## 2013-07-19 MED ORDER — OXYCODONE-ACETAMINOPHEN 5-325 MG PO TABS: 1.0000 | ORAL_TABLET | Freq: Four times a day (QID) | ORAL | Status: DC | PRN

## 2013-07-19 MED ORDER — PRENATAL MULTIVITAMIN CH: 1.0000 | ORAL_TABLET | Freq: Every day | ORAL | Status: DC

## 2013-07-19 NOTE — Progress Notes (Signed)
Post Partum Day 2 Subjective: no complaints, up ad lib, voiding, tolerating PO and nl lochia, pain controlled  Objective: Blood pressure 120/68, pulse 79, temperature 98.2 F (36.8 C), temperature source Oral, resp. rate 18, height 5\' 5"  (1.651 m), weight 79.833 kg (176 lb), SpO2 98.00%, unknown if currently breastfeeding.  Physical Exam:  General: alert and no distress Lochia: appropriate Uterine Fundus: firm  Recent Labs  07/17/13 0825 07/18/13 0545  HGB 13.0 9.0*  HCT 38.0 27.2*    Assessment/Plan: Discharge home, Breastfeeding and Lactation consult.  D/C with motrin, percocet, and PNV.  F/u 6 wks.     LOS: 2 days   Bovard-Stuckert, Jody 07/19/2013, 7:17 AM

## 2013-07-19 NOTE — Discharge Summary (Signed)
Obstetric Discharge Summary Reason for Admission: rupture of membranes Prenatal Procedures: none Intrapartum Procedures: spontaneous vaginal delivery Postpartum Procedures: none Complications-Operative and Postpartum: 1st degree perineal laceration and labial HGB  Date Value Ref Range Status  05/28/2013 11.2* 11.6 - 15.9 g/dL Final     Hemoglobin  Date Value Ref Range Status  07/18/2013 9.0* 12.0 - 15.0 g/dL Final     DELTA CHECK NOTED     REPEATED TO VERIFY     HCT  Date Value Ref Range Status  07/18/2013 27.2* 36.0 - 46.0 % Final  05/28/2013 33.6* 34.8 - 46.6 % Final    Physical Exam:  General: alert and no distress Lochia: appropriate Uterine Fundus: firm  Discharge Diagnoses: Term Pregnancy-delivered  Discharge Information: Date: 07/19/2013 Activity: pelvic rest Diet: routine Medications: PNV, Ibuprofen and Percocet Condition: stable Instructions: refer to practice specific booklet Discharge to: home Follow-up Information   Follow up with Bovard-Stuckert, Jody, MD. Schedule an appointment as soon as possible for a visit in 6 weeks. (for post-partum check)    Specialty:  Obstetrics and Gynecology   Contact information:   [address] [place] [phone]       Newborn Data: Live born female  Birth Weight: 5 lb 14.5 oz (2680 g) APGAR: 8, 9  Home with mother.  Bovard-Stuckert, Jody 07/19/2013, 7:22 AM

## 2013-08-08 ENCOUNTER — Telehealth: Payer: Self-pay | Admitting: Internal Medicine

## 2013-08-08 NOTE — Telephone Encounter (Signed)
pt called to r/s appt d.t ...done...pt awareof new d.t

## 2013-08-12 ENCOUNTER — Other Ambulatory Visit: Payer: BC Managed Care – PPO

## 2013-08-12 ENCOUNTER — Ambulatory Visit: Payer: BC Managed Care – PPO | Admitting: Internal Medicine

## 2013-08-12 ENCOUNTER — Ambulatory Visit: Payer: BC Managed Care – PPO

## 2013-08-19 ENCOUNTER — Ambulatory Visit (HOSPITAL_BASED_OUTPATIENT_CLINIC_OR_DEPARTMENT_OTHER): Payer: BC Managed Care – PPO | Admitting: Internal Medicine

## 2013-08-19 ENCOUNTER — Other Ambulatory Visit (HOSPITAL_BASED_OUTPATIENT_CLINIC_OR_DEPARTMENT_OTHER): Payer: BC Managed Care – PPO

## 2013-08-19 VITALS — BP 109/69 | HR 68 | Temp 98.7°F | Resp 20 | Ht 65.0 in | Wt 146.0 lb

## 2013-08-19 DIAGNOSIS — IMO0001 Reserved for inherently not codable concepts without codable children: Secondary | ICD-10-CM

## 2013-08-19 DIAGNOSIS — D72829 Elevated white blood cell count, unspecified: Secondary | ICD-10-CM | POA: Insufficient documentation

## 2013-08-19 LAB — BASIC METABOLIC PANEL (CC13)
ANION GAP: 8 meq/L (ref 3–11)
BUN: 10.2 mg/dL (ref 7.0–26.0)
CO2: 27 mEq/L (ref 22–29)
Calcium: 9.8 mg/dL (ref 8.4–10.4)
Chloride: 105 mEq/L (ref 98–109)
Creatinine: 0.8 mg/dL (ref 0.6–1.1)
Glucose: 77 mg/dl (ref 70–140)
POTASSIUM: 4.4 meq/L (ref 3.5–5.1)
Sodium: 140 mEq/L (ref 136–145)

## 2013-08-19 LAB — CBC WITH DIFFERENTIAL/PLATELET
BASO%: 0.8 % (ref 0.0–2.0)
Basophils Absolute: 0.1 10e3/uL (ref 0.0–0.1)
EOS%: 1.4 % (ref 0.0–7.0)
Eosinophils Absolute: 0.2 10e3/uL (ref 0.0–0.5)
HCT: 39.9 % (ref 34.8–46.6)
HGB: 12.9 g/dL (ref 11.6–15.9)
LYMPH%: 25.8 % (ref 14.0–49.7)
MCH: 29.5 pg (ref 25.1–34.0)
MCHC: 32.4 g/dL (ref 31.5–36.0)
MCV: 91.1 fL (ref 79.5–101.0)
MONO#: 0.7 10e3/uL (ref 0.1–0.9)
MONO%: 6.7 % (ref 0.0–14.0)
NEUT#: 7.1 10e3/uL — ABNORMAL HIGH (ref 1.5–6.5)
NEUT%: 65.3 % (ref 38.4–76.8)
Platelets: 382 10e3/uL (ref 145–400)
RBC: 4.38 10e6/uL (ref 3.70–5.45)
RDW: 15.6 % — ABNORMAL HIGH (ref 11.2–14.5)
WBC: 10.9 10e3/uL — ABNORMAL HIGH (ref 3.9–10.3)
lymph#: 2.8 10e3/uL (ref 0.9–3.3)

## 2013-08-19 NOTE — Progress Notes (Signed)
[facility] OFFICE PROGRESS NOTE  No PCP Per Patient No address on file  DIAGNOSIS: Leukocytosis, unspecified  Active labor  Chief Complaint  Patient presents with  . Leukocytosis, unspecified    CURRENT TREATMENT: Observation.  INTERVAL HISTORY: Stephanie Schroeder 33 y.o. female with a history of leukocytosis non otherwise specified is here for follow up.  She was initially seen by me on 05/28/2013.  She reports giving birth (spontaneously vaginal without complication) about one month ago.  She a had a healthy baby girl.  She is scheduled for a six-week evaluatioin with Dr. Jackelyn Knife.    On initial consultation, she reported a history of bipolar disorder, depressive disorder and was [redacted] weeks gestation, referred for further evaluation of her leukocytosis by Dr. Lavina Hamman. This is her first pregnancy. She reported good prenatal visits (monthly). She noted persistence in her elevated WBCs. Her CBC on 05/16/2013 revealed a WBC of 23.6 with elevated neutrophils (absolute) of 18.8; lymphs of 3.3 and monocytes of 1.2. Her hemoglobin was 11.1 and hematocrit was 32.5 . In August 06, 2009, she was [facility] her WBC of 9.2. They first checked her complete blood count in December. She reported being told she had an dental infection in January 2015 by Dr. Beckie Salts office. She noted that she required root canal therapy. She reported a history of wisdom tooth on the left upper and lower with removal in November, 2014. She reported being treated for a dental infections following these extractions. She denied any steroid use. She denied any new medications. She remained of Effexor 75 mg daily since her second trimester. She was on it previously but took herself off this medication a couple years ago. She denied fevers or chills. She denied urinary symptoms such as burning with urination. 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 Her  remaining paps have been normal. Her weight started at 133 lbs and she weighed 167 on initial consultation. She quit smoking for the most part with occasional cigarettes doing pregnancy.   MEDICAL HISTORY: Past Medical History  Diagnosis Date  . Bipolar 1 disorder   . Depression   . ROM (rupture of membranes), premature 07/17/2013  . SVD (spontaneous vaginal delivery) 07/17/2013    INTERIM HISTORY: has Active labor; ROM (rupture of membranes), premature; SVD (spontaneous vaginal delivery); and Leukocytosis, unspecified on her problem list.    ALLERGIES:  is allergic to risperidone and related.  MEDICATIONS: has a current medication list which includes the following prescription(s): ibuprofen, prenatal multivitamin, venlafaxine xr, and oxycodone-acetaminophen.  SURGICAL HISTORY:  Past Surgical History  Procedure Laterality Date  . Upper gi endoscopy      REVIEW OF SYSTEMS:   Constitutional: Denies fevers, chills or abnormal weight loss Eyes: Denies blurriness of vision Ears, nose, mouth, throat, and face: Denies mucositis or sore throat Respiratory: Denies cough, dyspnea or wheezes Cardiovascular: Denies palpitation, chest discomfort or lower extremity swelling Gastrointestinal:  Denies nausea, heartburn or change in bowel habits Skin: Denies abnormal skin rashes Lymphatics: Denies new lymphadenopathy or easy bruising Neurological:Denies numbness, tingling or new weaknesses Behavioral/Psych: Mood is stable, no new changes  All other systems were reviewed with the patient and are negative.  PHYSICAL EXAMINATION: ECOG PERFORMANCE STATUS: 0 - Asymptomatic  Blood pressure 109/69, pulse 68, temperature 98.7 F (37.1 C), temperature source Oral, resp. rate 20, height 5\' 5"  (1.651 m), weight 146 lb (66.225 kg), unknown if currently breastfeeding.  GENERAL:alert,  no distress and comfortable; well developed and well nourished. SKIN: skin color, texture, turgor are normal, no rashes or  significant lesions EYES: normal, Conjunctiva are pink and non-injected, sclera clear OROPHARYNX:no exudate, no erythema and lips, buccal mucosa, and tongue normal  NECK: supple, thyroid normal size, non-tender, without nodularity LYMPH:  no palpable lymphadenopathy in the cervical, axillary or supraclavicular LUNGS: clear to auscultation with normal breathing effort, no wheezes or rhonchi HEART: regular rate & rhythm and no murmurs and no lower extremity edema ABDOMEN:abdomen soft, non-tender and normal bowel sounds Musculoskeletal:no cyanosis of digits and no clubbing  NEURO: alert & oriented x 3 with fluent speech, no focal motor/sensory deficits  Labs:  Lab Results  Component Value Date   WBC 10.9* 08/19/2013   HGB 12.9 08/19/2013   HCT 39.9 08/19/2013   MCV 91.1 08/19/2013   PLT 382 08/19/2013   NEUTROABS 7.1* 08/19/2013      Chemistry      Component Value Date/Time   NA 140 08/19/2013 1541   NA 140 08/06/2009 1206   K 4.4 08/19/2013 1541   K 4.3 08/06/2009 1206   CL 108 08/06/2009 1206   CO2 27 08/19/2013 1541   BUN 10.2 08/19/2013 1541   BUN 11 08/06/2009 1206   CREATININE 0.8 08/19/2013 1541   CREATININE 0.8 08/06/2009 1206      Component Value Date/Time   CALCIUM 9.8 08/19/2013 1541   ALKPHOS 119 05/28/2013 1038   ALKPHOS 59 08/16/2009 0654   AST 11 05/28/2013 1038   AST 15 08/16/2009 0654   ALT 9 05/28/2013 1038   ALT 16 08/16/2009 0654   BILITOT <0.20 05/28/2013 1038   BILITOT 0.4 08/16/2009 0654       Basic Metabolic Panel:  Recent Labs Lab 08/19/13 1541  NA 140  K 4.4  CO2 27  GLUCOSE 77  BUN 10.2  CREATININE 0.8  CALCIUM 9.8   GFR Estimated Creatinine Clearance: 90.8 ml/min (by C-G formula based on Cr of 0.8). Liver Function Tests: No results found for this basename: AST, ALT, ALKPHOS, BILITOT, PROT, ALBUMIN,  in the last 168 hours No results found for this basename: LIPASE, AMYLASE,  in the last 168 hours No results found for this basename: AMMONIA,  in the last  168 hours Coagulation profile No results found for this basename: INR, PROTIME,  in the last 168 hours  CBC:  Recent Labs Lab 08/19/13 1540  WBC 10.9*  NEUTROABS 7.1*  HGB 12.9  HCT 39.9  MCV 91.1  PLT 382    Anemia work up No results found for this basename: VITAMINB12, FOLATE, FERRITIN, TIBC, IRON, RETICCTPCT,  in the last 72 hours  Studies:  No results found.   RADIOGRAPHIC STUDIES: No results found.  ASSESSMENT: Stephanie Schroeder 33 y.o. female with a history of Leukocytosis, unspecified  Active labor   PLAN:   1. Leukocytosis NOS likely secondary to pregnancy, nearly resolved.  --Her WBC are 10.9 today. Last visit, we discussed common causes of elevated wbcs including infection, inflammation, medication-induced, i.e., steroids, versus smoking and pregnancy. She denies fevers or receipt of antibiotics suggestive of infectious etiology. She denies cough, dysuria. She reports a normal UA recently from her primary care physician's office. She does have carpal tunnel that is mild. She denies steroid use presently. Therefore, pregnancy should be considered. It has been associated with leukocytosis, primarily related to increased circulation of neutrophils. Her neutrophil count is elevated as noted above. The neutrophil count begins to increase in the second  month of pregnancy and plateaus in the second or third trimester, at which time the total white blood cell counts range from 9000 to 15,000 cells/microL. The white blood cell count falls to the normal nonpregnant range by the sixth day postpartum.   2. Follow-up.  --Follow up as needed. If WBCs persistently elevated post-pregnancy, we will re-evaluate her CBC.   All questions were answered. The patient knows to call the clinic with any problems, questions or concerns. We can certainly see the patient much sooner if necessary.  I spent 10 minutes counseling the patient face to face. The total time spent in the appointment was  15 minutes.    Stephanie Loyal, MD 08/19/2013 5:16 PM

## 2013-08-21 ENCOUNTER — Telehealth: Payer: Self-pay | Admitting: Internal Medicine

## 2013-08-21 NOTE — Telephone Encounter (Signed)
Per 07/27 POF 1 yr f/u, mailed AVS to pt.Marland Kitchen...KJ

## 2013-11-25 ENCOUNTER — Encounter (HOSPITAL_COMMUNITY): Payer: Self-pay | Admitting: *Deleted

## 2014-06-09 ENCOUNTER — Telehealth: Payer: Self-pay | Admitting: Internal Medicine

## 2014-06-09 NOTE — Telephone Encounter (Signed)
Called and left a message with cp1 appointment moved to dr Dossie Dermohamed  Stephanie Schroeder

## 2014-08-20 ENCOUNTER — Ambulatory Visit: Payer: Medicaid Other | Admitting: Internal Medicine

## 2014-08-22 ENCOUNTER — Ambulatory Visit: Payer: BC Managed Care – PPO

## 2015-01-28 ENCOUNTER — Telehealth: Payer: Self-pay | Admitting: *Deleted

## 2015-01-28 NOTE — Telephone Encounter (Signed)
Opened in error

## 2018-03-15 ENCOUNTER — Other Ambulatory Visit: Payer: Self-pay | Admitting: Obstetrics and Gynecology

## 2018-03-15 DIAGNOSIS — N644 Mastodynia: Secondary | ICD-10-CM

## 2018-03-21 ENCOUNTER — Other Ambulatory Visit (HOSPITAL_COMMUNITY): Payer: Self-pay | Admitting: *Deleted

## 2018-03-21 DIAGNOSIS — N644 Mastodynia: Secondary | ICD-10-CM

## 2018-03-21 DIAGNOSIS — N6320 Unspecified lump in the left breast, unspecified quadrant: Secondary | ICD-10-CM

## 2018-04-10 ENCOUNTER — Ambulatory Visit
Admission: RE | Admit: 2018-04-10 | Discharge: 2018-04-10 | Disposition: A | Discharge status: Home / Self Care | Payer: No Typology Code available for payment source | Source: Ambulatory Visit | Attending: Obstetrics and Gynecology | Admitting: Obstetrics and Gynecology

## 2018-04-10 ENCOUNTER — Encounter (HOSPITAL_COMMUNITY): Payer: Self-pay

## 2018-04-10 ENCOUNTER — Ambulatory Visit (HOSPITAL_COMMUNITY)
Admission: RE | Admit: 2018-04-10 | Discharge: 2018-04-10 | Disposition: A | Discharge status: Home / Self Care | Payer: Self-pay | Source: Ambulatory Visit | Attending: Obstetrics and Gynecology | Admitting: Obstetrics and Gynecology

## 2018-04-10 ENCOUNTER — Other Ambulatory Visit: Payer: Self-pay

## 2018-04-10 VITALS — BP 110/78 | Wt 136.0 lb

## 2018-04-10 DIAGNOSIS — N644 Mastodynia: Secondary | ICD-10-CM

## 2018-04-10 DIAGNOSIS — Z1239 Encounter for other screening for malignant neoplasm of breast: Secondary | ICD-10-CM

## 2018-04-10 DIAGNOSIS — N6320 Unspecified lump in the left breast, unspecified quadrant: Secondary | ICD-10-CM

## 2018-04-10 NOTE — Patient Instructions (Signed)
Explained breast self awareness with Stephanie Schroeder. Patient did not need a Pap smear today due to last Pap smear was in November 2019 per patient. Advised patient to talk with her physician to determine when her next Pap smear is recommended due to patient stated she has a history of an abnormal Pap smear and the results are not in Epic. Referred patient to the [facility] for a diagnostic mammogram and left breast ultrasound. Appointment scheduled for Tuesday, April 10, 2018 at 1440. Patient aware of appointment and will be there. Brenisha Koder verbalized understanding.  Brannock, Kathaleen Maser, RN 1:36 PM

## 2018-04-10 NOTE — Progress Notes (Signed)
Complaints of a left breast lump since March 08, 2018 that is painful. Patient states the pain comes and goes. Patient rates the pain at a 8 out of 10. Patient complained of left axillary pain x 2 weeks that comes and goes. Patient rates the pain at a 5 out of 10. Patient stated that she has noticed a change in her left nipple x 2 weeks.  Pap Smear: Pap smear not completed today. Last Pap smear was in November 2019 at [facility] and normal per patient. Per patient has a history of an abnormal Pap smear 7 years ago that a colposcopy was completed for follow-up. Patient stated she has had at least two normal Pap smears since colposcopy. No Pap smear results are in Epic.  Physical exam: Breasts Breasts symmetrical. No skin abnormalities bilateral breasts. No nipple retraction bilateral breasts. No nipple discharge bilateral breasts. No lymphadenopathy. No lumps palpated bilateral breasts. Unable to palpate a lump in patients area of concern. Complaints of left outer breast tenderness on exam. Referred patient to the [facility] for a diagnostic mammogram and left breast ultrasound. Appointment scheduled for Tuesday, April 10, 2018 at 1440.        Pelvic/Bimanual No Pap smear completed today since last Pap smear was in November 2019 per patient. Pap smear not indicated per BCCCP guidelines.   Smoking History: Patient is a former smoker that quit July 17, 2017.  Patient Navigation: Patient education provided. Access to services provided for patient through BCCCP program.   Breast and Cervical Cancer Risk Assessment: Patient has no family history of breast cancer, known genetic mutations, or radiation treatment to the chest before age 54. Per patient has a history of cervical dysplasia. Patient has no history of being immunocompromised or DES exposure in-utero.  Risk Assessment    Risk Scores      04/10/2018   Last edited by: Lynnell Dike, LPN   5-year risk: 0.5 %   Lifetime risk: 13.8 %

## 2018-05-02 ENCOUNTER — Encounter (HOSPITAL_COMMUNITY): Payer: Self-pay | Admitting: *Deleted

## 2018-06-05 ENCOUNTER — Other Ambulatory Visit: Payer: Self-pay | Admitting: Obstetrics and Gynecology

## 2018-06-05 DIAGNOSIS — R922 Inconclusive mammogram: Secondary | ICD-10-CM

## 2018-06-08 ENCOUNTER — Other Ambulatory Visit: Payer: Self-pay

## 2018-06-08 ENCOUNTER — Ambulatory Visit
Admission: RE | Admit: 2018-06-08 | Discharge: 2018-06-08 | Disposition: A | Discharge status: Home / Self Care | Payer: No Typology Code available for payment source | Source: Ambulatory Visit | Attending: Obstetrics and Gynecology | Admitting: Obstetrics and Gynecology

## 2018-06-08 DIAGNOSIS — R922 Inconclusive mammogram: Secondary | ICD-10-CM

## 2018-06-08 MED ORDER — GADOBUTROL 1 MMOL/ML IV SOLN
7.0000 mL | Freq: Once | INTRAVENOUS | Status: AC | PRN
  Administered 2018-06-08: 7 mL via INTRAVENOUS

## 2018-06-22 NOTE — Progress Notes (Addendum)
Subjective:    Patient ID: Stephanie Schroeder, female    DOB: [birth year], 38 y.o.   MRN: [id number]  HPI: Stephanie Schroeder presents is here to establish as a new pt.  She is a pleasant 38 year old female. PMH: Depression, Bipolar 1 Disorder-per pt she has not has any mental health issues since teens, early 66s. She presents with acute issue: Feb 2020: L breast- palpable mass: She has undergone diagnostic mammogram, [place], Breast MRI- all neg All reviewed with her at length by her OB/GYN- Dr. Elmon Kirschner CBC- normal: run due to her complaint of several palpable lymph nodes- L axillary, R inguinal, bil popliteal. 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 She has one daughter, age 40, "Stephanie Schroeder" Her father passed away from a drug overdose She reports a strong support system of friends and her mother lives one mile away She has not been in a romantic relationship >1.5 years  Patient Care Team    Relationship Specialty Notifications Start End  Tekoa Amon, Jinny Blossom, NP PCP - General Family Medicine  06/25/18     Patient Active Problem List   Diagnosis Date Noted  . Swollen lymph nodes 06/26/2018  . Weight gain 06/25/2018  . Fatigue 06/25/2018  . Healthcare maintenance 06/25/2018  . Leukocytosis, unspecified 08/19/2013  . Active labor 07/17/2013  . ROM (rupture of membranes), premature 07/17/2013  . SVD (spontaneous vaginal delivery) 07/17/2013     Past Medical History:  Diagnosis Date  . Bipolar 1 disorder (HCC)   . Depression   . ROM (rupture of membranes), premature 07/17/2013  . SVD (spontaneous vaginal delivery) 07/17/2013     Past Surgical History:  Procedure  Laterality Date  . UPPER GI ENDOSCOPY       Family History  Problem Relation Age of Onset  . Hypertension Mother   . Alcohol abuse Neg Hx   . Arthritis Neg Hx   . Asthma Neg Hx   . Birth defects Neg Hx   . Cancer Neg Hx   . COPD Neg Hx   . Depression Neg Hx   . Diabetes Neg Hx   . Drug abuse Neg Hx   . Early death Neg Hx   . Hearing loss Neg Hx   . Heart disease Neg Hx   . Hyperlipidemia Neg Hx   . Kidney disease Neg Hx   . Learning disabilities Neg Hx   . Mental illness Neg Hx   . Mental retardation Neg Hx   . Miscarriages / Stillbirths Neg Hx   . Stroke Neg Hx   . Vision loss Neg Hx   . Varicose Veins Neg Hx      Social History   Substance and Sexual Activity  Drug Use No   Comment: History of cannibus use     Social History   Substance and Sexual Activity  Alcohol Use No     Social History   Tobacco Use  Smoking Status Former Smoker  . Packs/day: 0.50  . Last attempt to quit: 07/17/2017  . Years since quitting: 0.9  Smokeless Tobacco Former User     Outpatient Encounter Medications as of 06/25/2018  Medication Sig Note  . cetirizine (ZYRTEC)  10 MG tablet Take 10 mg by mouth daily.   . [DISCONTINUED] ibuprofen (ADVIL,MOTRIN) 800 MG tablet Take 1 tablet (800 mg total) by mouth every 8 (eight) hours as needed.   Marland Kitchen. oxyCODONE-acetaminophen (PERCOCET/ROXICET) 5-325 MG per tablet Take 1-2 tablets by mouth every 6 (six) hours as needed for severe pain (moderate - severe pain). (Patient not taking: Reported on 04/10/2018)   . [DISCONTINUED] Prenatal Vit-Fe Fumarate-FA (PRENATAL MULTIVITAMIN) TABS tablet Take 1 tablet by mouth daily at 12 noon. (Patient not taking: Reported on 04/10/2018)   . [DISCONTINUED] venlafaxine XR (EFFEXOR-XR) 75 MG 24 hr capsule Take 1 capsule by mouth daily. 05/28/2013: Received from: External Pharmacy Received Sig:    No facility-administered encounter medications on file as of 06/25/2018.     Allergies: Risperidone and related  Body  mass index is 24.28 kg/m.  Blood pressure 108/67, pulse 67, temperature 98.7 F (37.1 C), temperature source Oral, height 5\' 5"  (1.651 m), weight 145 lb 14.4 oz (66.2 kg), SpO2 99 %, unknown if currently breastfeeding.  Review of Systems  Constitutional: Positive for fatigue. Negative for activity change, appetite change, chills, diaphoresis, fever and unexpected weight change.  HENT: Negative for congestion.   Eyes: Negative for visual disturbance.  Respiratory: Negative for cough, chest tightness, shortness of breath, wheezing and stridor.   Cardiovascular: Negative for chest pain, palpitations and leg swelling.  Gastrointestinal: Negative for abdominal distention, abdominal pain, blood in stool, constipation, diarrhea, nausea and vomiting.  Endocrine: Negative for cold intolerance, heat intolerance, polydipsia, polyphagia and polyuria.  Genitourinary: Negative for difficulty urinating and flank pain.  Musculoskeletal: Negative for arthralgias, gait problem, joint swelling, myalgias, neck pain and neck stiffness.  Skin: Negative for color change, pallor, rash and wound.  Allergic/Immunologic: Negative for immunocompromised state.  Neurological: Negative for dizziness and headaches.  Hematological: Positive for adenopathy. Does not bruise/bleed easily.  Psychiatric/Behavioral: Negative for agitation, confusion, decreased concentration, dysphoric mood, hallucinations, self-injury, sleep disturbance and suicidal ideas. The patient is not nervous/anxious and is not hyperactive.        Objective:   Physical Exam Vitals signs and nursing note reviewed.  Constitutional:      General: She is not in acute distress.    Appearance: Normal appearance. She is not ill-appearing, toxic-appearing or diaphoretic.  Cardiovascular:     Rate and Rhythm: Normal rate.     Pulses: Normal pulses.     Heart sounds: Normal heart sounds. No murmur. No friction rub. No gallop.   Pulmonary:     Effort:  Pulmonary effort is normal. No respiratory distress.     Breath sounds: Normal breath sounds. No stridor. No wheezing, rhonchi or rales.  Chest:     Chest wall: No tenderness.  Abdominal:     General: Abdomen is flat. Bowel sounds are normal. There is no distension.     Palpations: Abdomen is soft. There is no mass.     Tenderness: There is no abdominal tenderness. There is no right CVA tenderness, left CVA tenderness, guarding or rebound.     Hernia: No hernia is present.     Comments: LLQ/L Inguinal Area- slight tenderness with palpation   Musculoskeletal:        General: Tenderness present.     Comments: Bil Popliteal- tenderness with palpation No tenderness over bil gastrocnemius  Lymphadenopathy:     Upper Body:     Left upper body: Axillary adenopathy present.  Skin:    General: Skin is warm and dry.  Capillary Refill: Capillary refill takes less than 2 seconds.  Neurological:     Mental Status: She is alert and oriented to person, place, and time.  Psychiatric:        Mood and Affect: Mood normal.        Behavior: Behavior normal.        Thought Content: Thought content normal.        Judgment: Judgment normal.       Assessment & Plan:   1. Fatigue, unspecified type   2. Weight gain   3. Healthcare maintenance   4. Swollen lymph nodes     Healthcare maintenance We will call you when lab results are available. Remain well hydrated, follow Mediterranean diet. Continue regular exercise. Follow-up in 4 weeks.  Weight gain Thyroid Panel drawn today  Swollen lymph nodes Recent CBC by her ON/GYN- normal per pt- records requested Need to check HIV status No known family hx of lymphoma  No systemic sx's    FOLLOW-UP:  Return in about 4 weeks (around 07/23/2018) for Regular Follow Up, Unusual Wt Gain, Swollen Lymph Nodes.

## 2018-06-25 ENCOUNTER — Encounter: Payer: Self-pay | Admitting: Adult Health

## 2018-06-25 ENCOUNTER — Ambulatory Visit (INDEPENDENT_AMBULATORY_CARE_PROVIDER_SITE_OTHER): Payer: Self-pay | Admitting: Adult Health

## 2018-06-25 ENCOUNTER — Other Ambulatory Visit: Payer: Self-pay

## 2018-06-25 VITALS — BP 108/67 | HR 67 | Temp 98.7°F | Ht 65.0 in | Wt 145.9 lb

## 2018-06-25 DIAGNOSIS — R635 Abnormal weight gain: Secondary | ICD-10-CM

## 2018-06-25 DIAGNOSIS — Z0000 Encounter for general adult medical examination without abnormal findings: Secondary | ICD-10-CM

## 2018-06-25 DIAGNOSIS — R599 Enlarged lymph nodes, unspecified: Secondary | ICD-10-CM

## 2018-06-25 DIAGNOSIS — R5383 Other fatigue: Secondary | ICD-10-CM

## 2018-06-25 DIAGNOSIS — Z1239 Encounter for other screening for malignant neoplasm of breast: Secondary | ICD-10-CM | POA: Insufficient documentation

## 2018-06-25 NOTE — Assessment & Plan Note (Signed)
Thyroid Panel drawn today

## 2018-06-25 NOTE — Patient Instructions (Addendum)
Mediterranean Diet A Mediterranean diet refers to food and lifestyle choices that are based on the traditions of countries located on the [employer]. This way of eating has been shown to help prevent certain conditions and improve outcomes for people who have chronic diseases, like kidney disease and heart disease. What are tips for following this plan? Lifestyle  Cook and eat meals together with your family, when possible.  Drink enough fluid to keep your urine clear or pale yellow.  Be physically active every day. This includes: ? Aerobic exercise like running or swimming. ? Leisure activities like gardening, walking, or housework.  Get 7-8 hours of sleep each night.  If recommended by your health care provider, drink red wine in moderation. This means 1 glass a day for nonpregnant women and 2 glasses a day for men. A glass of wine equals 5 oz (150 mL). Reading food labels   Check the serving size of packaged foods. For foods such as rice and pasta, the serving size refers to the amount of cooked product, not dry.  Check the total fat in packaged foods. Avoid foods that have saturated fat or trans fats.  Check the ingredients list for added sugars, such as corn syrup. Shopping  At the grocery store, buy most of your food from the areas near the walls of the store. This includes: ? Fresh fruits and vegetables (produce). ? Grains, beans, nuts, and seeds. Some of these may be available in unpackaged forms or large amounts (in bulk). ? Fresh seafood. ? Poultry and eggs. ? Low-fat dairy products.  Buy whole ingredients instead of prepackaged foods.  Buy fresh fruits and vegetables in-season from local farmers markets.  Buy frozen fruits and vegetables in resealable bags.  If you do not have access to quality fresh seafood, buy precooked frozen shrimp or canned fish, such as tuna, salmon, or sardines.  Buy small amounts of raw or cooked vegetables, salads, or olives from  the deli or salad bar at your store.  Stock your pantry so you always have certain foods on hand, such as olive oil, canned tuna, canned tomatoes, rice, pasta, and beans. Cooking  Cook foods with extra-virgin olive oil instead of using butter or other vegetable oils.  Have meat as a side dish, and have vegetables or grains as your main dish. This means having meat in small portions or adding small amounts of meat to foods like pasta or stew.  Use beans or vegetables instead of meat in common dishes like chili or lasagna.  Experiment with different cooking methods. Try roasting or broiling vegetables instead of steaming or sauteing them.  Add frozen vegetables to soups, stews, pasta, or rice.  Add nuts or seeds for added healthy fat at each meal. You can add these to yogurt, salads, or vegetable dishes.  Marinate fish or vegetables using olive oil, lemon juice, garlic, and fresh herbs. Meal planning   Plan to eat 1 vegetarian meal one day each week. Try to work up to 2 vegetarian meals, if possible.  Eat seafood 2 or more times a week.  Have healthy snacks readily available, such as: ? Vegetable sticks with hummus. ? [place] yogurt. ? Fruit and nut trail mix.  Eat balanced meals throughout the week. This includes: ? Fruit: 2-3 servings a day ? Vegetables: 4-5 servings a day ? Low-fat dairy: 2 servings a day ? Fish, poultry, or lean meat: 1 serving a day ? Beans and legumes: 2 or more servings a week ?  Nuts and seeds: 1-2 servings a day ? Whole grains: 6-8 servings a day ? Extra-virgin olive oil: 3-4 servings a day  Limit red meat and sweets to only a few servings a month What are my food choices?  Mediterranean diet ? Recommended ? Grains: Whole-grain pasta. Brown rice. Bulgar wheat. Polenta. Couscous. Whole-wheat bread. Orpah Cobb. ? Vegetables: Artichokes. Beets. Broccoli. Cabbage. Carrots. Eggplant. Green beans. Chard. Kale. Spinach. Onions. Leeks. Peas. Squash.  Tomatoes. Peppers. Radishes. ? Fruits: Apples. Apricots. Avocado. Berries. Bananas. Cherries. Dates. Figs. Grapes. Lemons. Melon. Oranges. Peaches. Plums. Pomegranate. ? Meats and other protein foods: Beans. Almonds. Sunflower seeds. Pine nuts. Peanuts. Cod. Salmon. Scallops. Shrimp. Tuna. Tilapia. Clams. Oysters. Eggs. ? Dairy: Low-fat milk. Cheese. Greek yogurt. ? Beverages: Water. Red wine. Herbal tea. ? Fats and oils: Extra virgin olive oil. Avocado oil. Grape seed oil. ? Sweets and desserts: [place] yogurt with honey. Baked apples. Poached pears. Trail mix. ? Seasoning and other foods: Basil. Cilantro. Coriander. Cumin. Mint. Parsley. Sage. Rosemary. Tarragon. Garlic. Oregano. Thyme. Pepper. Balsalmic vinegar. Tahini. Hummus. Tomato sauce. Olives. Mushrooms. ? Limit these ? Grains: Prepackaged pasta or rice dishes. Prepackaged cereal with added sugar. ? Vegetables: Deep fried potatoes (french fries). ? Fruits: Fruit canned in syrup. ? Meats and other protein foods: Beef. Pork. Lamb. Poultry with skin. Hot dogs. Tomasa Blase. ? Dairy: Ice cream. Sour cream. Whole milk. ? Beverages: Juice. Sugar-sweetened soft drinks. Beer. Liquor and spirits. ? Fats and oils: Butter. Canola oil. Vegetable oil. Beef fat (tallow). Lard. ? Sweets and desserts: Cookies. Cakes. Pies. Candy. ? Seasoning and other foods: Mayonnaise. Premade sauces and marinades. ? The items listed may not be a complete list. Talk with your dietitian about what dietary choices are right for you. Summary  The Mediterranean diet includes both food and lifestyle choices.  Eat a variety of fresh fruits and vegetables, beans, nuts, seeds, and whole grains.  Limit the amount of red meat and sweets that you eat.  Talk with your health care provider about whether it is safe for you to drink red wine in moderation. This means 1 glass a day for nonpregnant women and 2 glasses a day for men. A glass of wine equals 5 oz (150 mL). This information  is not intended to replace advice given to you by your health care provider. Make sure you discuss any questions you have with your health care provider. Document Released: 09/03/2015 Document Revised: 10/06/2015 Document Reviewed: 09/03/2015 Elsevier Interactive Patient Education  [employer].  We will call you when lab results are available. Remain well hydrated, follow Mediterranean diet. Continue regular exercise. Follow-up in 4 weeks. WELCOME TO THE PRACTICE!

## 2018-06-25 NOTE — Assessment & Plan Note (Signed)
We will call you when lab results are available. Remain well hydrated, follow Mediterranean diet. Continue regular exercise. Follow-up in 4 weeks.

## 2018-06-26 ENCOUNTER — Telehealth: Payer: Self-pay | Admitting: *Deleted

## 2018-06-26 DIAGNOSIS — R599 Enlarged lymph nodes, unspecified: Secondary | ICD-10-CM | POA: Insufficient documentation

## 2018-06-26 LAB — T3, FREE

## 2018-06-26 LAB — TSH

## 2018-06-26 LAB — T4, FREE

## 2018-06-26 NOTE — Telephone Encounter (Signed)
Pt informed of below and transferred to scheduling to get this lab appointment scheduled. April Zimmerman Rumple, CMA

## 2018-06-26 NOTE — Telephone Encounter (Signed)
-----   Message from Julaine Fusi, NP sent at 06/26/2018  1:14 PM EDT ----- Peri Jefferson Afternoon April, Can you please call Ms. Wesolowski and profusely apologize that her blood sample was [facility]. Can you please have her come back in for a re-draw at her convenience. Thanks! Orpha Bur

## 2018-06-26 NOTE — Assessment & Plan Note (Addendum)
Recent CBC by her ON/GYN- normal per pt- records requested Need to check HIV status No known family hx of lymphoma  No systemic sx's

## 2018-06-27 ENCOUNTER — Other Ambulatory Visit: Payer: Self-pay

## 2018-06-27 ENCOUNTER — Other Ambulatory Visit (INDEPENDENT_AMBULATORY_CARE_PROVIDER_SITE_OTHER): Payer: Self-pay

## 2018-06-27 DIAGNOSIS — R635 Abnormal weight gain: Secondary | ICD-10-CM

## 2018-06-27 DIAGNOSIS — R599 Enlarged lymph nodes, unspecified: Secondary | ICD-10-CM

## 2018-06-27 DIAGNOSIS — R5383 Other fatigue: Secondary | ICD-10-CM

## 2018-06-27 DIAGNOSIS — Z0000 Encounter for general adult medical examination without abnormal findings: Secondary | ICD-10-CM

## 2018-06-27 NOTE — Progress Notes (Signed)
Patient came in for redraw due to [facility] error. MPulliam, CMA/RT(R)

## 2018-06-28 ENCOUNTER — Other Ambulatory Visit: Payer: Self-pay | Admitting: Adult Health

## 2018-06-28 DIAGNOSIS — R2232 Localized swelling, mass and lump, left upper limb: Secondary | ICD-10-CM

## 2018-06-28 DIAGNOSIS — R599 Enlarged lymph nodes, unspecified: Secondary | ICD-10-CM

## 2018-06-28 LAB — T3, FREE: T3, Free: 3.3 pg/mL (ref 2.0–4.4)

## 2018-06-28 LAB — TSH: TSH: 1.92 u[IU]/mL (ref 0.450–4.500)

## 2018-06-28 LAB — T4, FREE: Free T4: 1 ng/dL (ref 0.82–1.77)

## 2018-06-28 NOTE — Progress Notes (Signed)
Since Feb 2020 pt reports increasing lymphadenopathy of: L axillary, R groin, Bil popliteal areas CBC completed at her OB/GYN per pt was normal, records requested Thyroid panel-normal [place] ordered

## 2018-07-02 ENCOUNTER — Other Ambulatory Visit (HOSPITAL_COMMUNITY): Payer: Self-pay | Admitting: *Deleted

## 2018-07-02 ENCOUNTER — Other Ambulatory Visit (HOSPITAL_COMMUNITY): Payer: Self-pay | Admitting: Obstetrics and Gynecology

## 2018-07-02 DIAGNOSIS — R2232 Localized swelling, mass and lump, left upper limb: Secondary | ICD-10-CM

## 2018-07-03 ENCOUNTER — Other Ambulatory Visit: Payer: Self-pay

## 2018-07-10 ENCOUNTER — Other Ambulatory Visit: Payer: Self-pay

## 2018-07-10 ENCOUNTER — Encounter (HOSPITAL_COMMUNITY): Payer: Self-pay

## 2018-07-10 ENCOUNTER — Ambulatory Visit
Admission: RE | Admit: 2018-07-10 | Discharge: 2018-07-10 | Disposition: A | Discharge status: Home / Self Care | Payer: No Typology Code available for payment source | Source: Ambulatory Visit | Attending: Obstetrics and Gynecology | Admitting: Obstetrics and Gynecology

## 2018-07-10 ENCOUNTER — Ambulatory Visit (HOSPITAL_COMMUNITY)
Admission: RE | Admit: 2018-07-10 | Discharge: 2018-07-10 | Disposition: A | Discharge status: Home / Self Care | Payer: Self-pay | Source: Ambulatory Visit | Attending: Obstetrics and Gynecology | Admitting: Obstetrics and Gynecology

## 2018-07-10 DIAGNOSIS — R2232 Localized swelling, mass and lump, left upper limb: Secondary | ICD-10-CM

## 2018-07-10 DIAGNOSIS — N644 Mastodynia: Secondary | ICD-10-CM | POA: Insufficient documentation

## 2018-07-10 DIAGNOSIS — M79622 Pain in left upper arm: Secondary | ICD-10-CM | POA: Insufficient documentation

## 2018-07-10 NOTE — Progress Notes (Signed)
Complaints of left breast and axillary swelling with pain x 2 months. Patient states the pain comes and goes. Patient rates the pain at a 5 out of 10.  Pap Smear: Pap smear not completed today. Last Pap smear was in November 2019 at [facility] and normal per patient. Per patient has a history of an abnormal Pap smear 7 years ago that a colposcopy was completed for follow-up. Patient stated she has had at least two normal Pap smears since colposcopy. No Pap smear results are in Epic.  Physical exam: Breasts Breasts symmetrical. No skin abnormalities bilateral breasts. No nipple retraction bilateral breasts. No nipple discharge bilateral breasts. No lymphadenopathy. No lumps palpated bilateral breasts. Left breast and axilla swollen. Complaints of left outer breast and axillary pain on exam. Referred patient to the [facility] for a left breast diagnostic mammogram and left breast ultrasound. Appointment scheduled for Tuesday, July 10, 2018 at 1130.        Pelvic/Bimanual No Pap smear completed today since last Pap smear was in November 2019 per patient. Pap smear not indicated per B[facility]CP guidelines.   Smoking History: Patient is a former smoker that quit July 17, 2017.  Patient Navigation: Patient education provided. Access to services provided for patient through B[facility]CP program.   Breast and Cervical Cancer Risk Assessment: Patient has no family history of breast cancer, known genetic mutations, or radiation treatment to the chest before age 5. Per patient has a history of cervical dysplasia. Patient has no history of being immunocompromised or DES exposure in-utero.  Risk Assessment    No risk assessment data for the current encounter   Risk Scores      04/10/2018   Last edited by: Armond Hang, LPN   5-year risk: 0.5 %   Lifetime risk: 13.8 %

## 2018-07-10 NOTE — Patient Instructions (Addendum)
Explained breast self awareness with Stephanie Schroeder. Patient did not need a Pap smear today due to last Pap smear was in November 2019 per patient. Advised patient to talk with her physician to determine when her next Pap smear is recommended due to patient stated she has a history of an abnormal Pap smear and the results are not in Epic. Referred patient to the [facility] for a left breast diagnostic mammogram and left breast ultrasound. Appointment scheduled for Tuesday, July 10, 2018 at 1130. Karley Pho verbalized understanding.  Savannha Welle, Arvil Chaco, RN 12:16 PM

## 2018-07-12 ENCOUNTER — Encounter (HOSPITAL_COMMUNITY): Payer: Self-pay | Admitting: *Deleted

## 2018-07-19 NOTE — Progress Notes (Signed)
Subjective:    Patient ID: Stephanie Schroeder, female    DOB: 03/22/1980, 38 y.o.   MRN: [id number]  HPI:  06/25/2018 OV: HPI: Stephanie Schroeder presents is here to establish as a new pt.  She is a pleasant 38 year old female. PMH: Depression, Bipolar 1 Disorder-per pt she has not has any mental health issues since teens, early 6120s. She presents with acute issue: Feb 2020: L breast- palpable mass: She has undergone diagnostic mammogram, US, Breast MRI- all neg All reviewed with her at length by her OB/GYN- Dr. Elmon KirschnerBrovard CBC- normal: run due to her complaint of several palpable lymph nodes- L axillary, R inguinal, bil popliteal. 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 She has one daughter, age 38, "Stephanie Schroeder" Her father passed away from a drug overdose She reports a strong support system of friends and her mother lives one mile away She has not been in a romantic relationship >1.5 years  07/23/2018 OV: Stephanie Schroeder is here for continued concerns for lymphadenopathy. She had repeat imaging- June 2020- Dx L Breast: IMPRESSION: 1. No mammographic evidence of malignancy involving the LEFT breast. 2. No pathologic LEFT axillary lymphadenopathy.  RECOMMENDATION: Screening mammogram at age 38 unless there are persistent or intervening clinical concerns. (Code:SM-B-40A)  I have discussed the findings and recommendations with the patient. Results were also provided in writing at the conclusion of the visit. If applicable, a reminder letter will be sent to the patient regarding the next appointment.  BI-RADS CATEGORY  1: Negative.  US Axilla L: IMPRESSION: 1. No  mammographic evidence of malignancy involving the LEFT breast. 2. No pathologic LEFT axillary lymphadenopathy.  RECOMMENDATION: Screening mammogram at age 38 unless there are persistent or intervening clinical concerns. (Code:SM-B-40A)  I have discussed the findings and recommendations with the patient. Results were also provided in writing at the conclusion of the visit. If applicable, a reminder letter will be sent to the patient regarding the next appointment.  BI-RADS CATEGORY  1: Negative.  She denies fever/night sweats/N/V/D/unexplained wt loss. She has gained 4 lbs since initial OV 06/25/2018. She continues to abstain from tobacco/vape/ETOH use She continues to walk/practice yoga daily. She states "I was told I had White Blood Cell Count Disorder" during pregnancy. She denies unusual bleeding/bruising    Patient Care Team    Relationship Specialty Notifications Start End  Julaine Fusianford, Rickey Farrier D, NP PCP - General Family Medicine  06/25/18     Patient Active Problem List   Diagnosis Date Noted  . Lymphadenopathy 07/23/2018  . Healthcare maintenance 07/23/2018  . Left axillary pain 07/10/2018  . Breast pain, left 07/10/2018  . Screening breast examination 06/25/2018     Past Medical History:  Diagnosis Date  . Bipolar 1 disorder (HCC)   . Depression   . ROM (rupture of membranes), premature 07/17/2013  . SVD (spontaneous vaginal delivery) 07/17/2013     Past Surgical History:  Procedure Laterality Date  . UPPER GI ENDOSCOPY       Family History  Problem Relation Age of Onset  . Hypertension Mother   . Alcohol abuse Neg Hx   . Arthritis Neg Hx   . Asthma Neg Hx   .  Birth defects Neg Hx   . Cancer Neg Hx   . COPD Neg Hx   . Depression Neg Hx   . Diabetes Neg Hx   . Drug abuse Neg Hx   . Early death Neg Hx   . Hearing loss Neg Hx   . Heart disease Neg Hx   . Hyperlipidemia Neg Hx   . Kidney disease Neg Hx   . Learning disabilities Neg Hx   . Mental illness  Neg Hx   . Mental retardation Neg Hx   . Miscarriages / Stillbirths Neg Hx   . Stroke Neg Hx   . Vision loss Neg Hx   . Varicose Veins Neg Hx      Social History   Substance and Sexual Activity  Drug Use No   Comment: History of cannibus use     Social History   Substance and Sexual Activity  Alcohol Use No     Social History   Tobacco Use  Smoking Status Former Smoker  . Packs/day: 0.50  . Quit date: 07/17/2017  . Years since quitting: 1.0  Smokeless Tobacco Former User     Outpatient Encounter Medications as of 07/23/2018  Medication Sig  . cetirizine (ZYRTEC) 10 MG tablet Take 10 mg by mouth daily.  . [DISCONTINUED] oxyCODONE-acetaminophen (PERCOCET/ROXICET) 5-325 MG per tablet Take 1-2 tablets by mouth every 6 (six) hours as needed for severe pain (moderate - severe pain).   No facility-administered encounter medications on file as of 07/23/2018.     Allergies: Risperidone and related  Body mass index is 24.93 kg/m.  Blood pressure 111/72, pulse 74, temperature 99 F (37.2 C), temperature source Oral, height 5\' 5"  (1.651 m), weight 149 lb 12.8 oz (67.9 kg), SpO2 97 %, unknown if currently breastfeeding.     Review of Systems  Constitutional: Positive for fatigue. Negative for activity change, appetite change, chills, diaphoresis, fever and unexpected weight change.  HENT: Negative for congestion.   Eyes: Negative for visual disturbance.  Respiratory: Negative for cough, chest tightness, shortness of breath, wheezing and stridor.   Cardiovascular: Negative for chest pain, palpitations and leg swelling.  Gastrointestinal: Negative for abdominal distention, anal bleeding, blood in stool, constipation, diarrhea, nausea and vomiting.  Endocrine: Negative for cold intolerance, heat intolerance, polydipsia, polyphagia and polyuria.  Genitourinary: Negative for difficulty urinating and enuresis.  Musculoskeletal: Positive for joint swelling and myalgias.        Lymphadenopathy- L axillary, R popliteal   Skin: Negative for color change, pallor, rash and wound.  Neurological: Positive for headaches. Negative for dizziness.  Hematological: Negative for adenopathy. Does not bruise/bleed easily.  Psychiatric/Behavioral: Negative for agitation, behavioral problems, confusion, decreased concentration, dysphoric mood, hallucinations, self-injury, sleep disturbance and suicidal ideas. The patient is not nervous/anxious and is not hyperactive.        Objective:   Physical Exam Vitals signs and nursing note reviewed.  Constitutional:      General: She is not in acute distress.    Appearance: Normal appearance. She is not ill-appearing, toxic-appearing or diaphoretic.  HENT:     Head: Normocephalic and atraumatic.  Eyes:     Extraocular Movements: Extraocular movements intact.     Conjunctiva/sclera: Conjunctivae normal.     Pupils: Pupils are equal, round, and reactive to light.  Neck:     Musculoskeletal: Neck supple. No neck rigidity or muscular tenderness.  Cardiovascular:     Rate and Rhythm: Normal rate.     Pulses: Normal pulses.  Heart sounds: Normal heart sounds. No murmur. No friction rub. No gallop.   Pulmonary:     Effort: Pulmonary effort is normal. No respiratory distress.     Breath sounds: Normal breath sounds. No stridor. No wheezing, rhonchi or rales.  Chest:     Chest wall: No tenderness.  Lymphadenopathy:     Cervical: No cervical adenopathy.  Skin:    General: Skin is warm and dry.     Capillary Refill: Capillary refill takes less than 2 seconds.     Comments: L Axillary and R Popliteal: Swollen Lymph, non-tender when palpated.  No excessive warmth noted.   Neurological:     Mental Status: She is oriented to person, place, and time.  Psychiatric:        Attention and Perception: Attention normal.        Mood and Affect: Mood is anxious. Affect is tearful.        Speech: Speech normal.        Behavior: Behavior normal.         Thought Content: Thought content normal.        Cognition and Memory: Cognition and memory normal.        Judgment: Judgment normal.        Assessment & Plan:   1. Lymphadenopathy   2. Healthcare maintenance     Lymphadenopathy Remain well hydrated and follow heart healthy diet. Continue regular exercise. We will call you with lab results and plan of action tomorrow.   Healthcare maintenance Continue to social distance and wear a mask when in public. Please schedule complete physical in 6 months, fasting labs the week prior.    FOLLOW-UP:  Return in about 6 months (around 01/22/2019) for CPE, Fasting Labs.

## 2018-07-23 ENCOUNTER — Other Ambulatory Visit: Payer: Self-pay

## 2018-07-23 ENCOUNTER — Encounter: Payer: Self-pay | Admitting: Adult Health

## 2018-07-23 ENCOUNTER — Ambulatory Visit (INDEPENDENT_AMBULATORY_CARE_PROVIDER_SITE_OTHER): Payer: Self-pay | Admitting: Adult Health

## 2018-07-23 VITALS — BP 111/72 | HR 74 | Temp 99.0°F | Ht 65.0 in | Wt 149.8 lb

## 2018-07-23 DIAGNOSIS — Z0000 Encounter for general adult medical examination without abnormal findings: Secondary | ICD-10-CM

## 2018-07-23 DIAGNOSIS — R591 Generalized enlarged lymph nodes: Secondary | ICD-10-CM | POA: Insufficient documentation

## 2018-07-23 NOTE — Assessment & Plan Note (Signed)
Remain well hydrated and follow heart healthy diet. Continue regular exercise. We will call you with lab results and plan of action tomorrow.

## 2018-07-23 NOTE — Assessment & Plan Note (Signed)
Continue to social distance and wear a mask when in public. Please schedule complete physical in 6 months, fasting labs the week prior  

## 2018-07-23 NOTE — Patient Instructions (Signed)
Lymphadenopathy  Lymphadenopathy means that your lymph glands are swollen or larger than normal (enlarged). Lymph glands, also called lymph nodes, are collections of tissue that filter bacteria, viruses, and waste from your bloodstream. They are part of your body's disease-fighting system (immune system), which protects your body from germs. There may be different causes of lymphadenopathy, depending on where it is in your body. Some types go away on their own. Lymphadenopathy can occur anywhere that you have lymph glands, including these areas:  Neck (cervical lymphadenopathy).  Chest (mediastinal lymphadenopathy).  Lungs (hilar lymphadenopathy).  Underarms (axillary lymphadenopathy).  Groin (inguinal lymphadenopathy). When your immune system responds to germs, infection-fighting cells and fluid build up in your lymph glands. This causes some swelling and enlargement. If the lymph glands do not go back to normal after you have an infection or disease, your health care provider may do tests. These tests help to monitor your condition and find the reason why the glands are still swollen and enlarged. Follow these instructions at home:  Get plenty of rest.  Take over-the-counter and prescription medicines only as told by your health care provider. Your health care provider may recommend over-the-counter medicines for pain.  If directed, apply heat to swollen lymph glands as often as told by your health care provider. Use the heat source that your health care provider recommends, such as a moist heat pack or a heating pad. ? Place a towel between your skin and the heat source. ? Leave the heat on for 20-30 minutes. ? Remove the heat if your skin turns bright red. This is especially important if you are unable to feel pain, heat, or cold. You may have a greater risk of getting burned.  Check your affected lymph glands every day for changes. Check other lymph gland areas as told by your health  care provider. Check for changes such as: ? More swelling. ? Sudden increase in size. ? Redness or pain. ? Hardness.  Keep all follow-up visits as told by your health care provider. This is important. Contact a health care provider if you have:  Swelling that gets worse or spreads to other areas.  Problems with breathing.  Lymph glands that: ? Are still swollen after 2 weeks. ? Have suddenly gotten bigger. ? Are red, painful, or hard.  A fever or chills.  Fatigue.  A sore throat.  Pain in your abdomen.  Weight loss.  Night sweats. Get help right away if you have:  Fluid leaking from an enlarged lymph gland.  Severe pain.  Chest pain.  Shortness of breath. Summary  Lymphadenopathy means that your lymph glands are swollen or larger than normal (enlarged).  Lymph glands (also called lymph nodes) are collections of tissue that filter bacteria, viruses, and waste from the bloodstream. They are part of your body's disease-fighting system (immune system).  Lymphadenopathy can occur anywhere that you have lymph glands.  If your enlarged and swollen lymph glands do not go back to normal after you have an infection or disease, your health care provider may do tests to monitor your condition and find the reason why the glands are still swollen and enlarged.  Check your affected lymph glands every day for changes. Check other lymph gland areas as told by your health care provider. This information is not intended to replace advice given to you by your health care provider. Make sure you discuss any questions you have with your health care provider. Document Released: 10/20/2007 Document Revised: 12/23/2016 Document Reviewed:   11/25/2016 Elsevier Patient Education  2020 [place] well hydrated and follow heart healthy diet. Continue regular exercise. We will call you with lab results and plan of action tomorrow. Continue to social distance and wear a mask when  in public. Please schedule complete physical in 6 months, fasting labs the week prior. GREAT TO SEE YOU!

## 2018-07-24 ENCOUNTER — Telehealth: Payer: Self-pay | Admitting: Adult Health

## 2018-07-24 DIAGNOSIS — D72828 Other elevated white blood cell count: Secondary | ICD-10-CM

## 2018-07-24 LAB — CBC
Hematocrit: 41.1 % (ref 34.0–46.6)
Hemoglobin: 14 g/dL (ref 11.1–15.9)
MCH: 31 pg (ref 26.6–33.0)
MCHC: 34.1 g/dL (ref 31.5–35.7)
MCV: 91 fL (ref 79–97)
Platelets: 366 10*3/uL (ref 150–450)
RBC: 4.52 x10E6/uL (ref 3.77–5.28)
RDW: 12.1 % (ref 11.7–15.4)
WBC: 9.7 10*3/uL (ref 3.4–10.8)

## 2018-07-24 NOTE — Telephone Encounter (Signed)
Referral placed.  T. Todd Argabright, CMA 

## 2018-07-24 NOTE — Telephone Encounter (Signed)
Patient called states she saw the message on mychart & is requesting Referral to Hematology / Oncology by Valetta Fuller.   ---Forwarding pt's response from mychart results  Re: Referral.  --glh

## 2018-07-26 ENCOUNTER — Telehealth: Payer: Self-pay | Admitting: Hematology

## 2018-07-26 NOTE — Telephone Encounter (Signed)
Received a new patient referral from Mina Marble, NP for dx of elevated wbc. Pt has been cld and scheduled to see Dr. Irene Limbo on 7/14 at 10am. Aware to arrive 20 minutes early.

## 2018-08-07 ENCOUNTER — Telehealth: Payer: Self-pay | Admitting: Hematology

## 2018-08-07 ENCOUNTER — Other Ambulatory Visit: Payer: Self-pay

## 2018-08-07 ENCOUNTER — Inpatient Hospital Stay: Payer: Self-pay | Attending: Hematology | Admitting: Hematology

## 2018-08-07 VITALS — BP 115/72 | HR 65 | Temp 98.9°F | Resp 18 | Ht 65.0 in | Wt 148.2 lb

## 2018-08-07 DIAGNOSIS — F319 Bipolar disorder, unspecified: Secondary | ICD-10-CM

## 2018-08-07 DIAGNOSIS — Z87891 Personal history of nicotine dependence: Secondary | ICD-10-CM

## 2018-08-07 DIAGNOSIS — N644 Mastodynia: Secondary | ICD-10-CM

## 2018-08-07 DIAGNOSIS — R5383 Other fatigue: Secondary | ICD-10-CM

## 2018-08-07 DIAGNOSIS — N6320 Unspecified lump in the left breast, unspecified quadrant: Secondary | ICD-10-CM

## 2018-08-07 DIAGNOSIS — R591 Generalized enlarged lymph nodes: Secondary | ICD-10-CM

## 2018-08-07 DIAGNOSIS — J329 Chronic sinusitis, unspecified: Secondary | ICD-10-CM

## 2018-08-07 DIAGNOSIS — D72829 Elevated white blood cell count, unspecified: Secondary | ICD-10-CM

## 2018-08-07 NOTE — Progress Notes (Signed)
HEMATOLOGY/ONCOLOGY CONSULTATION NOTE  Date of Service: 08/07/2018  Patient Care Team: Julaine Fusianford, Katy D, NP as PCP - General (Family Medicine)  CHIEF COMPLAINTS/PURPOSE OF CONSULTATION:  1) Elevated WBC count 2) Left axillary and neck fullness and breast discomfort   HISTORY OF PRESENTING ILLNESS:   Stephanie Schroeder is a wonderful 38 y.o. female who has been referred to us by William HamburgerKaty Danford NP for evaluation and management of lymphadenopathy. The pt reports that she is doing well overall.  The pt reports no known medical problems. A few years ago, her WBC were elevated because she was pregnant. She takes Zyrtec for seasonal allergies, but does not take any other medications regularly. She gets sinus headaches if she does not take her medication. She is allergic to Resperidone.  She started feeling fatigued last year but thought it was because she has a busy lifestyle. In 02/2018, she felt two lumps on the lateral side of her left breast and noticed that her breasts were different sizes. She saw her OBGYN in 03/2018, who suggested imaging. In 04/2018, she reported that her armpit appeared swollen and was examined again by her OBGYN, who recommended an MRI. She gained 20 lbs in February/March. Her PCP checked her thyroid levels, which were normal.   Pt notes that the lymph nodes in her neck have been swollen since February and she is experiencing some swelling behind her knees. The swelling does not cause discomfort except for when she was getting her mammogram and US. The discomfort is only on the left side. Pt also reports fatigue. She gets tired running around with her child and feels like she needs a nap by lunchtime.  Pt is worried because she is raising her 38 yo alone after the father passed away. She has been following a Mediterranean diet and exercising for the past 2 wks. Denies fever and chills. She had some night sweats last fall, but they resolved. Her periods have been irregular, but  the pt has had a Mirena IUD since 2015. Her periods were normal until last October, then became irregular. Pt manages her anxiety with yoga and meditation.  Of note prior to the patient's visit today, pt has had MR Breast w and wo contrast completed on 06/08/2018 for a palpable mass with results revealing Breast composition c and "No suspicious enhancement identified within either breast."  She also had a digital diagnostic left mammogram with tomo and left axilla US on 07/10/2018, which revealed breast density category c and "1. No mammographic evidence of malignancy involving the LEFT breast, 2. No pathologic LEFT axillary lymphadenopathy."  Most recent lab results (07/23/2018) of CBC is as follows: all values are WNL.  On review of systems, pt reports sleeping well, fatigue, swelling behind the knees, swollen lymph nodes in the neck and denies fever, chills, belly pain, and any other symptoms.   On PMHx the pt reports no surgeries. On Social Hx the pt reports having a 38 yo at home. Denies smoking and alcohol use. Denies exposure to animals and chemicals. She works in [employer], but is not currently employed because of COVID On Family Hx the pt reports no hx of blood disorders, cancer, or any other issues.  MEDICAL HISTORY:  Past Medical History:  Diagnosis Date   Bipolar 1 disorder (HCC)    Depression    ROM (rupture of membranes), premature 07/17/2013   SVD (spontaneous vaginal delivery) 07/17/2013    SURGICAL HISTORY: Past Surgical History:  Procedure Laterality Date  UPPER GI ENDOSCOPY      SOCIAL HISTORY: Social History   Socioeconomic History   Marital status: Single    Spouse name: Not on file   Number of children: 1   Years of education: Not on file   Highest education level: Bachelor's degree (e.g., BA, AB, BS)  Occupational History   Not on file  Social Needs   Financial resource strain: Not on file   Food insecurity    Worry: Not on file     Inability: Not on file   Transportation needs    Medical: No    Non-medical: No  Tobacco Use   Smoking status: Former Smoker    Packs/day: 0.50    Quit date: 07/17/2017    Years since quitting: 1.0   Smokeless tobacco: Former Engineer, water[employer]  Substance and Sexual Activity   Alcohol use: No   Drug use: No    Comment: History of cannibus use   Sexual activity: Yes    Birth control/protection: I.U.D.  Lifestyle   Physical activity    Days per week: Not on file    Minutes per session: Not on file   Stress: Not on file  Relationships   Social connections    Talks on phone: Not on file    Gets together: Not on file    Attends religious service: Not on file    Active member of club or organization: Not on file    Attends meetings of clubs or organizations: Not on file    Relationship status: Not on file   Intimate partner violence    Fear of current or ex partner: Not on file    Emotionally abused: Not on file    Physically abused: Not on file    Forced sexual activity: Not on file  Other Topics Concern   Not on file  Social History Narrative   Not on file    FAMILY HISTORY: Family History  Problem Relation Age of Onset   Hypertension Mother    Alcohol abuse Neg Hx    Arthritis Neg Hx    Asthma Neg Hx    Birth defects Neg Hx    Cancer Neg Hx    COPD Neg Hx    Depression Neg Hx    Diabetes Neg Hx    Drug abuse Neg Hx    Early death Neg Hx    Hearing loss Neg Hx    Heart disease Neg Hx    Hyperlipidemia Neg Hx    Kidney disease Neg Hx    Learning disabilities Neg Hx    Mental illness Neg Hx    Mental retardation Neg Hx    Miscarriages / Stillbirths Neg Hx    Stroke Neg Hx    Vision loss Neg Hx    Varicose Veins Neg Hx     ALLERGIES:  is allergic to risperidone and related.  MEDICATIONS:  Current Outpatient Medications  Medication Sig Dispense Refill   cetirizine (ZYRTEC) 10 MG tablet Take 10 mg by mouth daily.     No current  facility-administered medications for this visit.     REVIEW OF SYSTEMS:    A 10+ POINT REVIEW OF SYSTEMS WAS OBTAINED including neurology, dermatology, psychiatry, cardiac, respiratory, lymph, extremities, GI, GU, Musculoskeletal, constitutional, breasts, reproductive, HEENT.  All pertinent positives are noted in the HPI.  All others are negative.   PHYSICAL EXAMINATION: ECOG PERFORMANCE STATUS: 0 - Asymptomatic  . Vitals:   08/07/18 1025  BP: 115/72  Pulse: 65  Resp: 18  Temp: 98.9 F (37.2 C)  SpO2: 99%   Filed Weights   08/07/18 1025  Weight: 148 lb 3.2 oz (67.2 kg)   .Body mass index is 24.66 kg/m.  GENERAL: alert, in no acute distress and comfortable SKIN: no acute rashes, no significant lesions. Small mole in left axilla that has been present for a long time. EYES: conjunctiva are pink and non-injected, sclera anicteric OROPHARYNX: MMM, no exudates, no oropharyngeal erythema or ulceration NECK: supple, no JVD. 1 cm or less lymph node in neck. LYMPH:  no palpable lymphadenopathy in the axillary or inguinal regions. LUNGS: clear to auscultation b/l with normal respiratory effort HEART: regular rate & rhythm ABDOMEN:  normoactive bowel sounds , non tender, not distended. Extremity: no pedal edema PSYCH: alert & oriented x 3 with fluent speech NEURO: no focal motor/sensory deficits  LABORATORY DATA:  I have reviewed the data as listed  . CBC Latest Ref Rng & Units 07/23/2018 08/19/2013 07/18/2013  WBC 3.4 - 10.8 x10E3/uL 9.7 10.9(H) 21.4(H)  Hemoglobin 11.1 - 15.9 g/dL 14.0 12.9 9.0(L)  Hematocrit 34.0 - 46.6 % 41.1 39.9 27.2(L)  Platelets 150 - 450 x10E3/uL 366 382 245    . CMP Latest Ref Rng & Units 08/19/2013 05/28/2013 08/16/2009  Glucose 70 - 140 mg/dl 77 109 -  BUN 7.0 - 26.0 mg/dL 10.2 3.9(L) -  Creatinine 0.6 - 1.1 mg/dL 0.8 0.6 -  Sodium 136 - 145 mEq/L 140 141 -  Potassium 3.5 - 5.1 mEq/L 4.4 3.4(L) -  Chloride 96 - 112 mEq/L - - -  CO2 22 - 29 mEq/L  27 21(L) -  Calcium 8.4 - 10.4 mg/dL 9.8 9.7 -  Total Protein 6.4 - 8.3 g/dL - 5.7(L) 6.7  Total Bilirubin 0.20 - 1.20 mg/dL - <0.20 0.4  Alkaline Phos 40 - 150 U/L - 119 59  AST 5 - 34 U/L - 11 15  ALT 0 - 55 U/L - 9 16     RADIOGRAPHIC STUDIES: I have personally reviewed the radiological images as listed and agreed with the findings in the report. Mm Diag Breast Tomo Uni Left  Result Date: 07/10/2018 CLINICAL DATA:  38 year old presenting with LEFT axillary swelling and tenderness. EXAM: DIGITAL DIAGNOSTIC LEFT MAMMOGRAM WITH CAD AND TOMO ULTRASOUND LEFT AXILLA COMPARISON:  Mammography 04/10/2018. BILATERAL Breast MRI 06/08/2018. No prior LEFT axillary ultrasound. ACR Breast Density Category c: The breast tissue is heterogeneously dense, which may obscure small masses. FINDINGS: Tomosynthesis and synthesized full field CC and MLO views of the LEFT breast were obtained. Tomosynthesis and synthesized spot tangential view of the LEFT axilla was also obtained. Multiple normal appearing lymph nodes in the LEFT axilla are identified on the spot tangential view. No mass or pathologic lymphadenopathy is identified. No findings suspicious for malignancy in the LEFT breast. Mammographic images were processed with CAD. On correlative physical exam, there is asymmetric prominence of the LEFT axillary fat pad, though I do not palpate a mass or pathologic lymphadenopathy. Targeted LEFT axillary ultrasound is performed, showing multiple normal appearing lymph nodes. No mass or pathologic lymphadenopathy is identified. IMPRESSION: 1. No mammographic evidence of malignancy involving the LEFT breast. 2. No pathologic LEFT axillary lymphadenopathy. RECOMMENDATION: Screening mammogram at age 78 unless there are persistent or intervening clinical concerns. (Code:SM-B-40A) I have discussed the findings and recommendations with the patient. Results were also provided in writing at the conclusion of the visit. If  applicable, a reminder letter will be sent to  the patient regarding the next appointment. BI-RADS CATEGORY  1: Negative. Electronically Signed   By: Hulan Saashomas  Lawrence M.D.   On: 07/10/2018 12:08   [place] Axilla Left  Result Date: 07/10/2018 CLINICAL DATA:  38 year old presenting with LEFT axillary swelling and tenderness. EXAM: DIGITAL DIAGNOSTIC LEFT MAMMOGRAM WITH CAD AND TOMO ULTRASOUND LEFT AXILLA COMPARISON:  Mammography 04/10/2018. BILATERAL Breast MRI 06/08/2018. No prior LEFT axillary ultrasound. ACR Breast Density Category c: The breast tissue is heterogeneously dense, which may obscure small masses. FINDINGS: Tomosynthesis and synthesized full field CC and MLO views of the LEFT breast were obtained. Tomosynthesis and synthesized spot tangential view of the LEFT axilla was also obtained. Multiple normal appearing lymph nodes in the LEFT axilla are identified on the spot tangential view. No mass or pathologic lymphadenopathy is identified. No findings suspicious for malignancy in the LEFT breast. Mammographic images were processed with CAD. On correlative physical exam, there is asymmetric prominence of the LEFT axillary fat pad, though I do not palpate a mass or pathologic lymphadenopathy. Targeted LEFT axillary ultrasound is performed, showing multiple normal appearing lymph nodes. No mass or pathologic lymphadenopathy is identified. IMPRESSION: 1. No mammographic evidence of malignancy involving the LEFT breast. 2. No pathologic LEFT axillary lymphadenopathy. RECOMMENDATION: Screening mammogram at age 38 unless there are persistent or intervening clinical concerns. (Code:SM-B-40A) I have discussed the findings and recommendations with the patient. Results were also provided in writing at the conclusion of the visit. If applicable, a reminder letter will be sent to the patient regarding the next appointment. BI-RADS CATEGORY  1: Negative. Electronically Signed   By: Hulan Saashomas  Lawrence M.D.   On: 07/10/2018  12:08    ASSESSMENT & PLAN:   1) Elevated WBC -- pregnancy related previously - now normal  2) Left axillary and neck fullness and breast discomfort -- imaging studies show no pathological lymphadenopathy. Likely due to weight gain. Normal thyroid function and no clinically palpable lymphadenopahy. Chronic sinusitis.   PLAN: -Discussed patient's most recent labs from 07/23/2018, blood counts were normal.  -Discussed results of 07/10/2018 US and 06/08/2018 MRI. Axillary lymph nodes are normal. -Discussed effects of hormone fluctuation and weight gain on breast  fibroglandular changes. - Recommend staying physically active, drinking lots of fluids, and cutting down on caffeine. Also recommend taking Vitamin E - No obvious clinical pathology. Monitor symptoms and labs with PCP, William HamburgerKaty Danford NP - Repeat labs and scans in the future if necessary.  RTC with Dr Candise CheKale as needed  All of the patients questions were answered with apparent satisfaction. The patient knows to call the clinic with any problems, questions or concerns.  I spent 25 minutes counseling the patient face to face. The total time spent in the appointment was 30 minutes and more than 50% was on counseling and direct patient cares.   Wyvonnia LoraGautam Kikuye Korenek MD MS AAHIVMS [facility] Hematology/Oncology Physician [facility]  (Office):       [phone] (Work cell):  [phone] (Fax):           [phone]  08/07/2018 1:57 PM  By signing my name below, I, Pietro CassisEmily Tufford, attest that this documentation has been prepared under the direction and in the presence of Candise CheKale, Corene CorneaGautam Kishore, MD. Electronically Signed: Pietro CassisEmily Tufford, Medical Scribe. 08/07/18. 1:57 PM.  .I have reviewed the above documentation for accuracy and completeness, and I agree with the above. Johney Maine.Jaiana Sheffer Kishore Chelsea Pedretti MD

## 2018-08-07 NOTE — Telephone Encounter (Signed)
Per 7/14 los RTC with Dr Irene Limbo as needed.

## 2018-08-07 NOTE — Patient Instructions (Signed)
Thank you for choosing [facility] to provide your oncology and hematology care.   Should you have questions after your visit to the [facility] ([facility]), please contact this office at [phone] between 8:30 AM and 4:30 PM. Voicemails left after 4:00 PM may not be returned until the following business day. Calls received after 4:30 PM will be answered by an off-site Nurse Triage Line.    Prescription Refills:  Please have your pharmacy contact us directly for most prescription requests.  Contact the office directly for refills of narcotics (pain medications). Allow 48-72 hours for refills.  Appointments: Please contact the [facility] scheduling department [phone] for questions regarding [facility] appointment scheduling.  Contact the schedulers with any scheduling changes so that your appointment can be rescheduled in a timely manner.   Central Scheduling for [facility] [phone] - Call to schedule procedures such as PET scans, CT scans, MRI, Ultrasound, etc.  To afford each patient quality time with our providers, please arrive 30 minutes before your scheduled appointment time.  If you arrive late for your appointment, you may be asked to reschedule.  We strive to give you quality time with our providers, and arriving late affects you and other patients whose appointments are after yours. If you are a no show for multiple scheduled visits, you may be dismissed from the clinic at the providers discretion.     Resources: [facility] Social Workers [phone] for additional information on assistance programs --Anne Cunningham/Abigail Elmore  [place] DSS  [phone]: Information regarding food stamps, Medicaid, and utility assistance [employer] [phone]   [place] [employer]'s shared-ride transportation service for eligible riders who have a disability that prevents them from riding the fixed route bus.   Medicare Rights Center 800-333-4114 Helps people with  Medicare understand their rights and benefits, navigate the Medicare system, and secure the quality healthcare they deserve [employer] 800-227-2345 Assists patients locate various types of support and financial assistance Cancer Care: 1-800-813-HOPE (4673) Provides financial assistance, online support groups, medication/co-pay assistance.      

## 2019-01-01 LAB — HM PAP SMEAR

## 2019-01-15 ENCOUNTER — Other Ambulatory Visit: Payer: Self-pay

## 2019-01-15 DIAGNOSIS — Z0000 Encounter for general adult medical examination without abnormal findings: Secondary | ICD-10-CM

## 2019-01-16 LAB — CBC WITH DIFFERENTIAL/PLATELET
Basophils Absolute: 0.1 10*3/uL (ref 0.0–0.2)
Basos: 1 %
EOS (ABSOLUTE): 0.2 10*3/uL (ref 0.0–0.4)
Eos: 2 %
Hematocrit: 41.8 % (ref 34.0–46.6)
Hemoglobin: 14 g/dL (ref 11.1–15.9)
Immature Grans (Abs): 0 10*3/uL (ref 0.0–0.1)
Immature Granulocytes: 0 %
Lymphocytes Absolute: 2.2 10*3/uL (ref 0.7–3.1)
Lymphs: 20 %
MCH: 31.1 pg (ref 26.6–33.0)
MCHC: 33.5 g/dL (ref 31.5–35.7)
MCV: 93 fL (ref 79–97)
Monocytes Absolute: 0.8 10*3/uL (ref 0.1–0.9)
Monocytes: 7 %
Neutrophils Absolute: 7.6 10*3/uL — ABNORMAL HIGH (ref 1.4–7.0)
Neutrophils: 70 %
Platelets: 393 10*3/uL (ref 150–450)
RBC: 4.5 x10E6/uL (ref 3.77–5.28)
RDW: 12.5 % (ref 11.7–15.4)
WBC: 11 10*3/uL — ABNORMAL HIGH (ref 3.4–10.8)

## 2019-01-16 LAB — COMPREHENSIVE METABOLIC PANEL
ALT: 11 IU/L (ref 0–32)
AST: 8 IU/L (ref 0–40)
Albumin/Globulin Ratio: 2 (ref 1.2–2.2)
Albumin: 4.4 g/dL (ref 3.8–4.8)
Alkaline Phosphatase: 75 IU/L (ref 39–117)
BUN/Creatinine Ratio: 16 (ref 9–23)
BUN: 10 mg/dL (ref 6–20)
Bilirubin Total: 0.3 mg/dL (ref 0.0–1.2)
CO2: 23 mmol/L (ref 20–29)
Calcium: 9.6 mg/dL (ref 8.7–10.2)
Chloride: 102 mmol/L (ref 96–106)
Creatinine, Ser: 0.64 mg/dL (ref 0.57–1.00)
GFR calc Af Amer: 131 mL/min/{1.73_m2} (ref >59)
GFR calc non Af Amer: 114 mL/min/{1.73_m2} (ref >59)
Globulin, Total: 2.2 g/dL (ref 1.5–4.5)
Glucose: 89 mg/dL (ref 65–99)
Potassium: 5.2 mmol/L (ref 3.5–5.2)
Sodium: 139 mmol/L (ref 134–144)
Total Protein: 6.6 g/dL (ref 6.0–8.5)

## 2019-01-16 LAB — LIPID PANEL
Chol/HDL Ratio: 4.1 ratio (ref 0.0–4.4)
Cholesterol, Total: 148 mg/dL (ref 100–199)
HDL: 36 mg/dL — ABNORMAL LOW (ref >39)
LDL Chol Calc (NIH): 98 mg/dL (ref 0–99)
Triglycerides: 72 mg/dL (ref 0–149)
VLDL Cholesterol Cal: 14 mg/dL (ref 5–40)

## 2019-01-16 LAB — HEMOGLOBIN A1C
Est. average glucose Bld gHb Est-mCnc: 100 mg/dL
Hgb A1c MFr Bld: 5.1 % (ref 4.8–5.6)

## 2019-01-16 LAB — TSH: TSH: 1.64 u[IU]/mL (ref 0.450–4.500)

## 2019-01-22 ENCOUNTER — Ambulatory Visit (INDEPENDENT_AMBULATORY_CARE_PROVIDER_SITE_OTHER): Payer: Self-pay | Admitting: Adult Health

## 2019-01-22 ENCOUNTER — Other Ambulatory Visit: Payer: Self-pay

## 2019-01-22 ENCOUNTER — Encounter: Payer: Self-pay | Admitting: Adult Health

## 2019-01-22 DIAGNOSIS — R591 Generalized enlarged lymph nodes: Secondary | ICD-10-CM

## 2019-01-22 DIAGNOSIS — Z0000 Encounter for general adult medical examination without abnormal findings: Secondary | ICD-10-CM

## 2019-01-22 NOTE — Assessment & Plan Note (Signed)
Decreas in HDL (good) cholesterol- Increase regular exercise.  Recommend at least 30 minutes daily, 5 days per week of walking, jogging, biking, swimming, YouTube/Pinterest workout videos. Increase water intake, strive for at least 75 ounces/day.   Follow Mediterranean diet. Complete blood count- slight increase in white blood count and neutrophil-if you develop fever/night sweats/change in appetite/malaise/swollen lymph nodes- follow-up with Dr. Kale/Hematolog/Oncology. CONGRATULATIONS ON FINISHING YOUR DEGREE! Continue to social distance and wear a mask when in public. Recommend annual physical with fasting labs. Recommend dilated eye examination.

## 2019-01-22 NOTE — Progress Notes (Signed)
Subjective:    Patient ID: Stephanie Schroeder, female    DOB: 06/06/1980, 38 y.o.   MRN: [id number]  HPI:  Stephanie Schroeder is here for CPE Ms. Stephanie Schroeder reports decrease in overall activity level since she stopped working in [employer] and has been a lot more sedentary. She has gained 8 lbs since last OV Current wt 156 Body mass index is 26.47 kg/m.  She is planning on re-starting a regular exercise program once her daughter returns to school next week. She complete her associates degree in [employer] with concentration in substance abuse counseling.  OB/GYN started her on Lexapro 10mg  QD to treat stress and peri-menopause sx's  She denies increased lymphadenopathy/fever/night sweats/malaise/change in appetite.  01/15/2019 Labs: TSH-WNL, 1.640 Lipid Panel- Total- 148 TGs-72 HDL-36 LDL-98 A1c-5.1 CMP-stable CBC-WBC 11 Neutrophil 7.6 Has had elevations in past  Remote hx of lymphadenopathy- has been evaluated by Hemat/Onco  "No obvious clinical pathology. Monitor symptoms and labs with PCP, Stephanie HamburgerKaty Suezette Lafave NP Repeat labs and scans in the future if necessary RTC with Dr Candise CheKale as needed"  Healthcare Maintenance: PAP-UTD, 01/01/2019-normal, repeat annually due to hx of abnormal PAP Immunizations-declined influenza vaccination  Patient Care Team    Relationship Specialty Notifications Start End  Julaine Fusianford, Amariah Kierstead D, NP PCP - General Family Medicine  06/25/18     Patient Active Problem List   Diagnosis Date Noted  . Lymphadenopathy 07/23/2018  . Healthcare maintenance 07/23/2018  . Left axillary pain 07/10/2018  . Breast pain, left 07/10/2018  . Screening breast examination 06/25/2018     Past Medical History:  Diagnosis Date  . Bipolar 1 disorder (HCC)   . Depression   . ROM (rupture of membranes), premature 07/17/2013  . SVD (spontaneous vaginal delivery) 07/17/2013     Past Surgical History:  Procedure Laterality Date  . UPPER GI ENDOSCOPY       Family  History  Problem Relation Age of Onset  . Hypertension Mother   . Alcohol abuse Neg Hx   . Arthritis Neg Hx   . Asthma Neg Hx   . Birth defects Neg Hx   . Cancer Neg Hx   . COPD Neg Hx   . Depression Neg Hx   . Diabetes Neg Hx   . Drug abuse Neg Hx   . Early death Neg Hx   . Hearing loss Neg Hx   . Heart disease Neg Hx   . Hyperlipidemia Neg Hx   . Kidney disease Neg Hx   . Learning disabilities Neg Hx   . Mental illness Neg Hx   . Mental retardation Neg Hx   . Miscarriages / Stillbirths Neg Hx   . Stroke Neg Hx   . Vision loss Neg Hx   . Varicose Veins Neg Hx      Social History   Substance and Sexual Activity  Drug Use No   Comment: History of cannibus use     Social History   Substance and Sexual Activity  Alcohol Use No     Social History   Tobacco Use  Smoking Status Former Smoker  . Packs/day: 0.50  . Quit date: 07/17/2017  . Years since quitting: 1.5  Smokeless Tobacco Former User     Outpatient Encounter Medications as of 01/22/2019  Medication Sig  . cetirizine (ZYRTEC) 10 MG tablet Take 10 mg by mouth daily.  Marland Kitchen. escitalopram (LEXAPRO) 10 MG tablet Take 1 tablet by mouth daily.  Marland Kitchen. levonorgestrel (MIRENA, 52 MG,) 20 MCG/24HR  IUD Mirena 20 mcg/24 hours (6 yrs) 52 mg intrauterine device  Take 1 device by intrauterine route.   No facility-administered encounter medications on file as of 01/22/2019.    Allergies: Risperidone and related  Body mass index is 26.47 kg/m.  Blood pressure 106/68, pulse 67, temperature 98.8 F (37.1 C), temperature source Oral, height 5' 4.5" (1.638 m), weight 156 lb 9.6 oz (71 kg), SpO2 98 %, unknown if currently breastfeeding.  Review of Systems  Constitutional: Negative for activity change, appetite change, chills, diaphoresis, fatigue, fever and unexpected weight change.  HENT: Negative for congestion.   Eyes: Negative for visual disturbance.  Respiratory: Negative for cough, chest tightness, shortness of  breath, wheezing and stridor.   Cardiovascular: Negative for chest pain, palpitations and leg swelling.  Gastrointestinal: Negative for abdominal distention, anal bleeding, blood in stool, constipation, diarrhea, nausea and vomiting.  Endocrine: Negative for polydipsia, polyphagia and polyuria.  Genitourinary: Negative for difficulty urinating, flank pain and hematuria.  Musculoskeletal: Negative for arthralgias, gait problem and myalgias.  Neurological: Negative for dizziness and headaches.  Hematological: Negative for adenopathy. Does not bruise/bleed easily.  Psychiatric/Behavioral: Negative for agitation, behavioral problems, confusion, decreased concentration, dysphoric mood, hallucinations, self-injury, sleep disturbance and suicidal ideas. The patient is not nervous/anxious and is not hyperactive.        Objective:   Physical Exam Vitals and nursing note reviewed.  Constitutional:      General: She is not in acute distress.    Appearance: Normal appearance. She is not ill-appearing, toxic-appearing or diaphoretic.  HENT:     Head: Normocephalic and atraumatic.     Right Ear: Tympanic membrane, ear canal and external ear normal. There is no impacted cerumen.     Left Ear: Tympanic membrane, ear canal and external ear normal. There is no impacted cerumen.     Nose: Nose normal. No congestion.     Mouth/Throat:     Mouth: Mucous membranes are moist.     Pharynx: No oropharyngeal exudate.  Eyes:     Extraocular Movements: Extraocular movements intact.     Conjunctiva/sclera: Conjunctivae normal.     Pupils: Pupils are equal, round, and reactive to light.  Cardiovascular:     Rate and Rhythm: Normal rate and regular rhythm.     Pulses: Normal pulses.     Heart sounds: Normal heart sounds. No murmur. No friction rub. No gallop.   Pulmonary:     Effort: Pulmonary effort is normal. No respiratory distress.     Breath sounds: Normal breath sounds. No stridor. No wheezing, rhonchi or  rales.  Chest:     Chest wall: No tenderness.  Abdominal:     General: Abdomen is flat. Bowel sounds are normal. There is no distension.     Palpations: There is no mass.     Tenderness: There is no abdominal tenderness. There is no right CVA tenderness, left CVA tenderness, guarding or rebound.     Hernia: No hernia is present.  Musculoskeletal:        General: Normal range of motion.     Cervical back: Normal range of motion and neck supple.  Skin:    General: Skin is warm and dry.     Capillary Refill: Capillary refill takes less than 2 seconds.  Neurological:     Mental Status: She is alert and oriented to person, place, and time.     Coordination: Coordination normal.  Psychiatric:        Mood and Affect: Mood  normal.        Behavior: Behavior normal.        Thought Content: Thought content normal.        Judgment: Judgment normal.        Assessment & Plan:   1. Healthcare maintenance   2. Lymphadenopathy     Healthcare maintenance Decreas in HDL (good) cholesterol- Increase regular exercise.  Recommend at least 30 minutes daily, 5 days per week of walking, jogging, biking, swimming, YouTube/Pinterest workout videos. Increase water intake, strive for at least 75 ounces/day.   Follow Mediterranean diet. Complete blood count- slight increase in white blood count and neutrophil-if you develop fever/night sweats/change in appetite/malaise/swollen lymph nodes- follow-up with Dr. Kale/Hematolog/Oncology. CONGRATULATIONS ON FINISHING YOUR DEGREE! Continue to social distance and wear a mask when in public. Recommend annual physical with fasting labs. Recommend dilated eye examination.  Lymphadenopathy Complete blood count- slight increase in white blood count and neutrophil-if you develop fever/night sweats/change in appetite/malaise/swollen lymph nodes- follow-up with Dr. Kale/Hematolog/Oncology.    FOLLOW-UP:  Return in about 1 year (around 01/22/2020) for CPE, Fasting  Labs.

## 2019-01-22 NOTE — Patient Instructions (Addendum)
Preventive Care for Adults, Female  A healthy lifestyle and preventive care can promote health and wellness. Preventive health guidelines for women include the following key practices.   A routine yearly physical is a good way to check with your health care provider about your health and preventive screening. It is a chance to share any concerns and updates on your health and to receive a thorough exam.   Visit your dentist for a routine exam and preventive care every 6 months. Brush your teeth twice a day and floss once a day. Good oral hygiene prevents tooth decay and gum disease.   The frequency of eye exams is based on your age, health, family medical history, use of contact lenses, and other factors. Follow your health care provider's recommendations for frequency of eye exams.   Eat a healthy diet. Foods like vegetables, fruits, whole grains, low-fat dairy products, and lean protein foods contain the nutrients you need without too many calories. Decrease your intake of foods high in solid fats, added sugars, and salt. Eat the right amount of calories for you.Get information about a proper diet from your health care provider, if necessary.   Regular physical exercise is one of the most important things you can do for your health. Most adults should get at least 150 minutes of moderate-intensity exercise (any activity that increases your heart rate and causes you to sweat) each week. In addition, most adults need muscle-strengthening exercises on 2 or more days a week.   Maintain a healthy weight. The body mass index (BMI) is a screening tool to identify possible weight problems. It provides an estimate of body fat based on height and weight. Your health care provider can find your BMI, and can help you achieve or maintain a healthy weight.For adults 20 years and older:   - A BMI below 18.5 is considered underweight.   - A BMI of 18.5 to 24.9 is normal.   - A BMI of 25 to 29.9 is  considered overweight.   - A BMI of 30 and above is considered obese.   Maintain normal blood lipids and cholesterol levels by exercising and minimizing your intake of trans and saturated fats.  Eat a balanced diet with plenty of fruit and vegetables. Blood tests for lipids and cholesterol should begin at age 20 and be repeated every 5 years minimum.  If your lipid or cholesterol levels are high, you are over 40, or you are at high risk for heart disease, you may need your cholesterol levels checked more frequently.Ongoing high lipid and cholesterol levels should be treated with medicines if diet and exercise are not working.   If you smoke, find out from your health care provider how to quit. If you do not use tobacco, do not start.   Lung cancer screening is recommended for adults aged 55-80 years who are at high risk for developing lung cancer because of a history of smoking. A yearly low-dose CT scan of the lungs is recommended for people who have at least a 30-pack-year history of smoking and are a current smoker or have quit within the past 15 years. A pack year of smoking is smoking an average of 1 pack of cigarettes a day for 1 year (for example: 1 pack a day for 30 years or 2 packs a day for 15 years). Yearly screening should continue until the smoker has stopped smoking for at least 15 years. Yearly screening should be stopped for people who develop a   health problem that would prevent them from having lung cancer treatment.   If you are pregnant, do not drink alcohol. If you are breastfeeding, be very cautious about drinking alcohol. If you are not pregnant and choose to drink alcohol, do not have more than 1 drink per day. One drink is considered to be 12 ounces (355 mL) of beer, 5 ounces (148 mL) of wine, or 1.5 ounces (44 mL) of liquor.   Avoid use of street drugs. Do not share needles with anyone. Ask for help if you need support or instructions about stopping the use of  drugs.   High blood pressure causes heart disease and increases the risk of stroke. Your blood pressure should be checked at least yearly.  Ongoing high blood pressure should be treated with medicines if weight loss and exercise do not work.   If you are 69-55 years old, ask your health care provider if you should take aspirin to prevent strokes.   Diabetes screening involves taking a blood sample to check your fasting blood sugar level. This should be done once every 3 years, after age 38, if you are within normal weight and without risk factors for diabetes. Testing should be considered at a younger age or be carried out more frequently if you are overweight and have at least 1 risk factor for diabetes.   Breast cancer screening is essential preventive care for women. You should practice "breast self-awareness."  This means understanding the normal appearance and feel of your breasts and may include breast self-examination.  Any changes detected, no matter how small, should be reported to a health care provider.  Women in their 80s and 30s should have a clinical breast exam (CBE) by a health care provider as part of a regular health exam every 1 to 3 years.  After age 66, women should have a CBE every year.  Starting at age 1, women should consider having a mammogram (breast X-ray test) every year.  Women who have a family history of breast cancer should talk to their health care provider about genetic screening.  Women at a high risk of breast cancer should talk to their health care providers about having an MRI and a mammogram every year.   -Breast cancer gene (BRCA)-related cancer risk assessment is recommended for women who have family members with BRCA-related cancers. BRCA-related cancers include breast, ovarian, tubal, and peritoneal cancers. Having family members with these cancers may be associated with an increased risk for harmful changes (mutations) in the breast cancer genes BRCA1 and  BRCA2. Results of the assessment will determine the need for genetic counseling and BRCA1 and BRCA2 testing.   The Pap test is a screening test for cervical cancer. A Pap test can show cell changes on the cervix that might become cervical cancer if left untreated. A Pap test is a procedure in which cells are obtained and examined from the lower end of the uterus (cervix).   - Women should have a Pap test starting at age 57.   - Between ages 90 and 70, Pap tests should be repeated every 2 years.   - Beginning at age 63, you should have a Pap test every 3 years as long as the past 3 Pap tests have been normal.   - Some women have medical problems that increase the chance of getting cervical cancer. Talk to your health care provider about these problems. It is especially important to talk to your health care provider if a  new problem develops soon after your last Pap test. In these cases, your health care provider may recommend more frequent screening and Pap tests.   - The above recommendations are the same for women who have or have not gotten the vaccine for human papillomavirus (HPV).   - If you had a hysterectomy for a problem that was not cancer or a condition that could lead to cancer, then you no longer need Pap tests. Even if you no longer need a Pap test, a regular exam is a good idea to make sure no other problems are starting.   - If you are between ages 36 and 66 years, and you have had normal Pap tests going back 10 years, you no longer need Pap tests. Even if you no longer need a Pap test, a regular exam is a good idea to make sure no other problems are starting.   - If you have had past treatment for cervical cancer or a condition that could lead to cancer, you need Pap tests and screening for cancer for at least 20 years after your treatment.   - If Pap tests have been discontinued, risk factors (such as a new sexual partner) need to be reassessed to determine if screening should  be resumed.   - The HPV test is an additional test that may be used for cervical cancer screening. The HPV test looks for the virus that can cause the cell changes on the cervix. The cells collected during the Pap test can be tested for HPV. The HPV test could be used to screen women aged 70 years and older, and should be used in women of any age who have unclear Pap test results. After the age of 67, women should have HPV testing at the same frequency as a Pap test.   Colorectal cancer can be detected and often prevented. Most routine colorectal cancer screening begins at the age of 57 years and continues through age 26 years. However, your health care provider may recommend screening at an earlier age if you have risk factors for colon cancer. On a yearly basis, your health care provider may provide home test kits to check for hidden blood in the stool.  Use of a small camera at the end of a tube, to directly examine the colon (sigmoidoscopy or colonoscopy), can detect the earliest forms of colorectal cancer. Talk to your health care provider about this at age 23, when routine screening begins. Direct exam of the colon should be repeated every 5 -10 years through age 49 years, unless early forms of pre-cancerous polyps or small growths are found.   People who are at an increased risk for hepatitis B should be screened for this virus. You are considered at high risk for hepatitis B if:  -You were born in a country where hepatitis B occurs often. Talk with your health care provider about which countries are considered high risk.  - Your parents were born in a high-risk country and you have not received a shot to protect against hepatitis B (hepatitis B vaccine).  - You have HIV or AIDS.  - You use needles to inject street drugs.  - You live with, or have sex with, someone who has Hepatitis B.  - You get hemodialysis treatment.  - You take certain medicines for conditions like cancer, organ  transplantation, and autoimmune conditions.   Hepatitis C blood testing is recommended for all people born from [birth year] through 1965 and any individual  with known risks for hepatitis C.   Practice safe sex. Use condoms and avoid high-risk sexual practices to reduce the spread of sexually transmitted infections (STIs). STIs include gonorrhea, chlamydia, syphilis, trichomonas, herpes, HPV, and human immunodeficiency virus (HIV). Herpes, HIV, and HPV are viral illnesses that have no cure. They can result in disability, cancer, and death. Sexually active women aged 25 years and younger should be checked for chlamydia. Older women with new or multiple partners should also be tested for chlamydia. Testing for other STIs is recommended if you are sexually active and at increased risk.   Osteoporosis is a disease in which the bones lose minerals and strength with aging. This can result in serious bone fractures or breaks. The risk of osteoporosis can be identified using a bone density scan. Women ages 65 years and over and women at risk for fractures or osteoporosis should discuss screening with their health care providers. Ask your health care provider whether you should take a calcium supplement or vitamin D to There are also several preventive steps women can take to avoid osteoporosis and resulting fractures or to keep osteoporosis from worsening. -->Recommendations include:  Eat a balanced diet high in fruits, vegetables, calcium, and vitamins.  Get enough calcium. The recommended total intake of is 1,200 mg daily; for best absorption, if taking supplements, divide doses into 250-500 mg doses throughout the day. Of the two types of calcium, calcium carbonate is best absorbed when taken with food but calcium citrate can be taken on an empty stomach.  Get enough vitamin D. NAMS and the National Osteoporosis Foundation recommend at least 1,000 IU per day for women age 50 and over who are at risk of vitamin D  deficiency. Vitamin D deficiency can be caused by inadequate sun exposure (for example, those who live in northern latitudes).  Avoid alcohol and smoking. Heavy alcohol intake (more than 7 drinks per week) increases the risk of falls and hip fracture and women smokers tend to lose bone more rapidly and have lower bone mass than nonsmokers. Stopping smoking is one of the most important changes women can make to improve their health and decrease risk for disease.  Be physically active every day. Weight-bearing exercise (for example, fast walking, hiking, jogging, and weight training) may strengthen bones or slow the rate of bone loss that comes with aging. Balancing and muscle-strengthening exercises can reduce the risk of falling and fracture.  Consider therapeutic medications. Currently, several types of effective drugs are available. Healthcare providers can recommend the type most appropriate for each woman.  Eliminate environmental factors that may contribute to accidents. Falls cause nearly 90% of all osteoporotic fractures, so reducing this risk is an important bone-health strategy. Measures include ample lighting, removing obstructions to walking, using nonskid rugs on floors, and placing mats and/or grab bars in showers.  Be aware of medication side effects. Some common medicines make bones weaker. These include a type of steroid drug called glucocorticoids used for arthritis and asthma, some antiseizure drugs, certain sleeping pills, treatments for endometriosis, and some cancer drugs. An overactive thyroid gland or using too much thyroid hormone for an underactive thyroid can also be a problem. If you are taking these medicines, talk to your doctor about what you can do to help protect your bones.reduce the rate of osteoporosis.    Menopause can be associated with physical symptoms and risks. Hormone replacement therapy is available to decrease symptoms and risks. You should talk to your  health care provider   about whether hormone replacement therapy is right for you.   Use sunscreen. Apply sunscreen liberally and repeatedly throughout the day. You should seek shade when your shadow is shorter than you. Protect yourself by wearing long sleeves, pants, a wide-brimmed hat, and sunglasses year round, whenever you are outdoors.   Once a month, do a whole body skin exam, using a mirror to look at the skin on your back. Tell your health care provider of new moles, moles that have irregular borders, moles that are larger than a pencil eraser, or moles that have changed in shape or color.   -Stay current with required vaccines (immunizations).   Influenza vaccine. All adults should be immunized every year.  Tetanus, diphtheria, and acellular pertussis (Td, Tdap) vaccine. Pregnant women should receive 1 dose of Tdap vaccine during each pregnancy. The dose should be obtained regardless of the length of time since the last dose. Immunization is preferred during the 27th 36th week of gestation. An adult who has not previously received Tdap or who does not know her vaccine status should receive 1 dose of Tdap. This initial dose should be followed by tetanus and diphtheria toxoids (Td) booster doses every 10 years. Adults with an unknown or incomplete history of completing a 3-dose immunization series with Td-containing vaccines should begin or complete a primary immunization series including a Tdap dose. Adults should receive a Td booster every 10 years.  Varicella vaccine. An adult without evidence of immunity to varicella should receive 2 doses or a second dose if she has previously received 1 dose. Pregnant females who do not have evidence of immunity should receive the first dose after pregnancy. This first dose should be obtained before leaving the health care facility. The second dose should be obtained 4 8 weeks after the first dose.  Human papillomavirus (HPV) vaccine. Females aged 13 26  years who have not received the vaccine previously should obtain the 3-dose series. The vaccine is not recommended for use in pregnant females. However, pregnancy testing is not needed before receiving a dose. If a female is found to be pregnant after receiving a dose, no treatment is needed. In that case, the remaining doses should be delayed until after the pregnancy. Immunization is recommended for any person with an immunocompromised condition through the age of 26 years if she did not get any or all doses earlier. During the 3-dose series, the second dose should be obtained 4 8 weeks after the first dose. The third dose should be obtained 24 weeks after the first dose and 16 weeks after the second dose.  Zoster vaccine. One dose is recommended for adults aged 60 years or older unless certain conditions are present.  Measles, mumps, and rubella (MMR) vaccine. Adults born before 1957 generally are considered immune to measles and mumps. Adults born in 1957 or later should have 1 or more doses of MMR vaccine unless there is a contraindication to the vaccine or there is laboratory evidence of immunity to each of the three diseases. A routine second dose of MMR vaccine should be obtained at least 28 days after the first dose for students attending postsecondary schools, health care workers, or international travelers. People who received inactivated measles vaccine or an unknown type of measles vaccine during 1963 1967 should receive 2 doses of MMR vaccine. People who received inactivated mumps vaccine or an unknown type of mumps vaccine before 1979 and are at high risk for mumps infection should consider immunization with 2 doses of   MMR vaccine. For females of childbearing age, rubella immunity should be determined. If there is no evidence of immunity, females who are not pregnant should be vaccinated. If there is no evidence of immunity, females who are pregnant should delay immunization until after pregnancy.  Unvaccinated health care workers born before [birth year] who lack laboratory evidence of measles, mumps, or rubella immunity or laboratory confirmation of disease should consider measles and mumps immunization with 2 doses of MMR vaccine or rubella immunization with 1 dose of MMR vaccine.  Pneumococcal 13-valent conjugate (PCV13) vaccine. When indicated, a person who is uncertain of her immunization history and has no record of immunization should receive the PCV13 vaccine. An adult aged 54 years or older who has certain medical conditions and has not been previously immunized should receive 1 dose of PCV13 vaccine. This PCV13 should be followed with a dose of pneumococcal polysaccharide (PPSV23) vaccine. The PPSV23 vaccine dose should be obtained at least 8 weeks after the dose of PCV13 vaccine. An adult aged 58 years or older who has certain medical conditions and previously received 1 or more doses of PPSV23 vaccine should receive 1 dose of PCV13. The PCV13 vaccine dose should be obtained 1 or more years after the last PPSV23 vaccine dose.  Pneumococcal polysaccharide (PPSV23) vaccine. When PCV13 is also indicated, PCV13 should be obtained first. All adults aged 58 years and older should be immunized. An adult younger than age 65 years who has certain medical conditions should be immunized. Any person who resides in a nursing home or long-term care facility should be immunized. An adult smoker should be immunized. People with an immunocompromised condition and certain other conditions should receive both PCV13 and PPSV23 vaccines. People with human immunodeficiency virus (HIV) infection should be immunized as soon as possible after diagnosis. Immunization during chemotherapy or radiation therapy should be avoided. Routine use of PPSV23 vaccine is not recommended for American Indians, [place] Natives, or people younger than 65 years unless there are medical conditions that require PPSV23 vaccine. When indicated,  people who have unknown immunization and have no record of immunization should receive PPSV23 vaccine. One-time revaccination 5 years after the first dose of PPSV23 is recommended for people aged 70 64 years who have chronic kidney failure, nephrotic syndrome, asplenia, or immunocompromised conditions. People who received 1 2 doses of PPSV23 before age 32 years should receive another dose of PPSV23 vaccine at age 96 years or later if at least 5 years have passed since the previous dose. Doses of PPSV23 are not needed for people immunized with PPSV23 at or after age 55 years.  Meningococcal vaccine. Adults with asplenia or persistent complement component deficiencies should receive 2 doses of quadrivalent meningococcal conjugate (MenACWY-D) vaccine. The doses should be obtained at least 2 months apart. Microbiologists working with certain meningococcal bacteria, [employer] recruits, people at risk during an outbreak, and people who travel to or live in countries with a high rate of meningitis should be immunized. A first-year college student up through age 58 years who is living in a residence hall should receive a dose if she did not receive a dose on or after her 16th birthday. Adults who have certain high-risk conditions should receive one or more doses of vaccine.  Hepatitis A vaccine. Adults who wish to be protected from this disease, have certain high-risk conditions, work with hepatitis A-infected animals, work in hepatitis A research labs, or travel to or work in countries with a high rate of hepatitis A should be  immunized. Adults who were previously unvaccinated and who anticipate close contact with an international adoptee during the first 60 days after arrival in the [place] from a country with a high rate of hepatitis A should be immunized.  Hepatitis B vaccine.  Adults who wish to be protected from this disease, have certain high-risk conditions, may be exposed to blood or other infectious  body fluids, are household contacts or sex partners of hepatitis B positive people, are clients or workers in certain care facilities, or travel to or work in countries with a high rate of hepatitis B should be immunized.  Haemophilus influenzae type b (Hib) vaccine. A previously unvaccinated person with asplenia or sickle cell disease or having a scheduled splenectomy should receive 1 dose of Hib vaccine. Regardless of previous immunization, a recipient of a hematopoietic stem cell transplant should receive a 3-dose series 6 12 months after her successful transplant. Hib vaccine is not recommended for adults with HIV infection.  Preventive Services / Frequency Ages 6 to 39years  Blood pressure check.** / Every 1 to 2 years.  Lipid and cholesterol check.** / Every 5 years beginning at age 39.  Clinical breast exam.** / Every 3 years for women in their 61s and 62s.  BRCA-related cancer risk assessment.** / For women who have family members with a BRCA-related cancer (breast, ovarian, tubal, or peritoneal cancers).  Pap test.** / Every 2 years from ages 47 through 85. Every 3 years starting at age 34 through age 12 or 74 with a history of 3 consecutive normal Pap tests.  HPV screening.** / Every 3 years from ages 46 through ages 43 to 54 with a history of 3 consecutive normal Pap tests.  Hepatitis C blood test.** / For any individual with known risks for hepatitis C.  Skin self-exam. / Monthly.  Influenza vaccine. / Every year.  Tetanus, diphtheria, and acellular pertussis (Tdap, Td) vaccine.** / Consult your health care provider. Pregnant women should receive 1 dose of Tdap vaccine during each pregnancy. 1 dose of Td every 10 years.  Varicella vaccine.** / Consult your health care provider. Pregnant females who do not have evidence of immunity should receive the first dose after pregnancy.  HPV vaccine. / 3 doses over 6 months, if 64 and younger. The vaccine is not recommended for use in  pregnant females. However, pregnancy testing is not needed before receiving a dose.  Measles, mumps, rubella (MMR) vaccine.** / You need at least 1 dose of MMR if you were born in 1957 or later. You may also need a 2nd dose. For females of childbearing age, rubella immunity should be determined. If there is no evidence of immunity, females who are not pregnant should be vaccinated. If there is no evidence of immunity, females who are pregnant should delay immunization until after pregnancy.  Pneumococcal 13-valent conjugate (PCV13) vaccine.** / Consult your health care provider.  Pneumococcal polysaccharide (PPSV23) vaccine.** / 1 to 2 doses if you smoke cigarettes or if you have certain conditions.  Meningococcal vaccine.** / 1 dose if you are age 71 to 37 years and a Market researcher living in a residence hall, or have one of several medical conditions, you need to get vaccinated against meningococcal disease. You may also need additional booster doses.  Hepatitis A vaccine.** / Consult your health care provider.  Hepatitis B vaccine.** / Consult your health care provider.  Haemophilus influenzae type b (Hib) vaccine.** / Consult your health care provider.  Ages 55 to 64years  Blood pressure check.** / Every 1 to 2 years.  Lipid and cholesterol check.** / Every 5 years beginning at age 20 years.  Lung cancer screening. / Every year if you are aged 55 80 years and have a 30-pack-year history of smoking and currently smoke or have quit within the past 15 years. Yearly screening is stopped once you have quit smoking for at least 15 years or develop a health problem that would prevent you from having lung cancer treatment.  Clinical breast exam.** / Every year after age 40 years.  BRCA-related cancer risk assessment.** / For women who have family members with a BRCA-related cancer (breast, ovarian, tubal, or peritoneal cancers).  Mammogram.** / Every year beginning at age 40  years and continuing for as long as you are in good health. Consult with your health care provider.  Pap test.** / Every 3 years starting at age 30 years through age 65 or 70 years with a history of 3 consecutive normal Pap tests.  HPV screening.** / Every 3 years from ages 30 years through ages 65 to 70 years with a history of 3 consecutive normal Pap tests.  Fecal occult blood test (FOBT) of stool. / Every year beginning at age 50 years and continuing until age 75 years. You may not need to do this test if you get a colonoscopy every 10 years.  Flexible sigmoidoscopy or colonoscopy.** / Every 5 years for a flexible sigmoidoscopy or every 10 years for a colonoscopy beginning at age 50 years and continuing until age 75 years.  Hepatitis C blood test.** / For all people born from 1945 through 1965 and any individual with known risks for hepatitis C.  Skin self-exam. / Monthly.  Influenza vaccine. / Every year.  Tetanus, diphtheria, and acellular pertussis (Tdap/Td) vaccine.** / Consult your health care provider. Pregnant women should receive 1 dose of Tdap vaccine during each pregnancy. 1 dose of Td every 10 years.  Varicella vaccine.** / Consult your health care provider. Pregnant females who do not have evidence of immunity should receive the first dose after pregnancy.  Zoster vaccine.** / 1 dose for adults aged 60 years or older.  Measles, mumps, rubella (MMR) vaccine.** / You need at least 1 dose of MMR if you were born in 1957 or later. You may also need a 2nd dose. For females of childbearing age, rubella immunity should be determined. If there is no evidence of immunity, females who are not pregnant should be vaccinated. If there is no evidence of immunity, females who are pregnant should delay immunization until after pregnancy.  Pneumococcal 13-valent conjugate (PCV13) vaccine.** / Consult your health care provider.  Pneumococcal polysaccharide (PPSV23) vaccine.** / 1 to 2 doses if  you smoke cigarettes or if you have certain conditions.  Meningococcal vaccine.** / Consult your health care provider.  Hepatitis A vaccine.** / Consult your health care provider.  Hepatitis B vaccine.** / Consult your health care provider.  Haemophilus influenzae type b (Hib) vaccine.** / Consult your health care provider.  Ages 65 years and over  Blood pressure check.** / Every 1 to 2 years.  Lipid and cholesterol check.** / Every 5 years beginning at age 20 years.  Lung cancer screening. / Every year if you are aged 55 80 years and have a 30-pack-year history of smoking and currently smoke or have quit within the past 15 years. Yearly screening is stopped once you have quit smoking for at least 15 years or develop a health problem that   would prevent you from having lung cancer treatment.  Clinical breast exam.** / Every year after age 103 years.  BRCA-related cancer risk assessment.** / For women who have family members with a BRCA-related cancer (breast, ovarian, tubal, or peritoneal cancers).  Mammogram.** / Every year beginning at age 36 years and continuing for as long as you are in good health. Consult with your health care provider.  Pap test.** / Every 3 years starting at age 5 years through age 85 or 10 years with 3 consecutive normal Pap tests. Testing can be stopped between 65 and 70 years with 3 consecutive normal Pap tests and no abnormal Pap or HPV tests in the past 10 years.  HPV screening.** / Every 3 years from ages 93 years through ages 70 or 45 years with a history of 3 consecutive normal Pap tests. Testing can be stopped between 65 and 70 years with 3 consecutive normal Pap tests and no abnormal Pap or HPV tests in the past 10 years.  Fecal occult blood test (FOBT) of stool. / Every year beginning at age 8 years and continuing until age 45 years. You may not need to do this test if you get a colonoscopy every 10 years.  Flexible sigmoidoscopy or colonoscopy.** /  Every 5 years for a flexible sigmoidoscopy or every 10 years for a colonoscopy beginning at age 69 years and continuing until age 68 years.  Hepatitis C blood test.** / For all people born from [birth year] through 1965 and any individual with known risks for hepatitis C.  Osteoporosis screening.** / A one-time screening for women ages 7 years and over and women at risk for fractures or osteoporosis.  Skin self-exam. / Monthly.  Influenza vaccine. / Every year.  Tetanus, diphtheria, and acellular pertussis (Tdap/Td) vaccine.** / 1 dose of Td every 10 years.  Varicella vaccine.** / Consult your health care provider.  Zoster vaccine.** / 1 dose for adults aged 5 years or older.  Pneumococcal 13-valent conjugate (PCV13) vaccine.** / Consult your health care provider.  Pneumococcal polysaccharide (PPSV23) vaccine.** / 1 dose for all adults aged 74 years and older.  Meningococcal vaccine.** / Consult your health care provider.  Hepatitis A vaccine.** / Consult your health care provider.  Hepatitis B vaccine.** / Consult your health care provider.  Haemophilus influenzae type b (Hib) vaccine.** / Consult your health care provider. ** Family history and personal history of risk and conditions may change your health care provider's recommendations. Document Released: 03/08/2001 Document Revised: 10/31/2012  [facility] Patient Information 2014 [place], [place].   EXERCISE AND DIET:  We recommended that you start or continue a regular exercise program for good health. Regular exercise means any activity that makes your heart beat faster and makes you sweat.  We recommend exercising at least 30 minutes per day at least 3 days a week, preferably 5.  We also recommend a diet low in fat and sugar / carbohydrates.  Inactivity, poor dietary choices and obesity can cause diabetes, heart attack, stroke, and kidney damage, among others.     ALCOHOL AND SMOKING:  Women should limit their alcohol intake to no  more than 7 drinks/beers/glasses of wine (combined, not each!) per week. Moderation of alcohol intake to this level decreases your risk of breast cancer and liver damage.  ( And of course, no recreational drugs are part of a healthy lifestyle.)  Also, you should not be smoking at all or even being exposed to second hand smoke. Most people know smoking can  cause cancer, and various heart and lung diseases, but did you know it also contributes to weakening of your bones?  Aging of your skin?  Yellowing of your teeth and nails?   CALCIUM AND VITAMIN D:  Adequate intake of calcium and Vitamin D are recommended.  The recommendations for exact amounts of these supplements seem to change often, but generally speaking 600 mg of calcium (either carbonate or citrate) and 800 units of Vitamin D per day seems prudent. Certain women may benefit from higher intake of Vitamin D.  If you are among these women, your doctor will have told you during your visit.     PAP SMEARS:  Pap smears, to check for cervical cancer or precancers,  have traditionally been done yearly, although recent scientific advances have shown that most women can have pap smears less often.  However, every woman still should have a physical exam from her gynecologist or primary care physician every year. It will include a breast check, inspection of the vulva and vagina to check for abnormal growths or skin changes, a visual exam of the cervix, and then an exam to evaluate the size and shape of the uterus and ovaries.  And after 38 years of age, a rectal exam is indicated to check for rectal cancers. We will also provide age appropriate advice regarding health maintenance, like when you should have certain vaccines, screening for sexually transmitted diseases, bone density testing, colonoscopy, mammograms, etc.    MAMMOGRAMS:  All women over 24 years old should have a yearly mammogram. Many facilities now offer a "3D" mammogram, which may cost  around $50 extra out of pocket. If possible,  we recommend you accept the option to have the 3D mammogram performed.  It both reduces the number of women who will be called back for extra views which then turn out to be normal, and it is better than the routine mammogram at detecting truly abnormal areas.     COLONOSCOPY:  Colonoscopy to screen for colon cancer is recommended for all women at age 65.  We know, you hate the idea of the prep.  We agree, BUT, having colon cancer and not knowing it is worse!!  Colon cancer so often starts as a polyp that can be seen and removed at colonscopy, which can quite literally save your life!  And if your first colonoscopy is normal and you have no family history of colon cancer, most women don't have to have it again for 10 years.  Once every ten years, you can do something that may end up saving your life, right?  We will be happy to help you get it scheduled when you are ready.  Be sure to check your insurance coverage so you understand how much it will cost.  It may be covered as a preventative service at no cost, but you should check your particular policy.     Decreas in HDL (good) cholesterol- Increase regular exercise.  Recommend at least 30 minutes daily, 5 days per week of walking, jogging, biking, swimming, YouTube/Pinterest workout videos. Increase water intake, strive for at least 75 ounces/day.   Follow Mediterranean diet. Complete blood count- slight increase in white blood count and neutrophil-if you develop fever/night sweats/change in appetite/malaise/swollen lymph nodes- follow-up with Dr. Kale/Hematolog/Oncology. CONGRATULATIONS ON FINISHING YOUR DEGREE! Continue to social distance and wear a mask when in public. Recommend annual physical with fasting labs. Recommend dilated eye examination. GREAT TO SEE YOU!

## 2019-01-22 NOTE — Assessment & Plan Note (Signed)
Complete blood count- slight increase in white blood count and neutrophil-if you develop fever/night sweats/change in appetite/malaise/swollen lymph nodes- follow-up with Dr. Kale/Hematolog/Oncology.

## 2019-10-11 ENCOUNTER — Encounter (HOSPITAL_COMMUNITY): Payer: Self-pay

## 2019-10-11 ENCOUNTER — Ambulatory Visit (HOSPITAL_COMMUNITY)
Admission: EM | Admit: 2019-10-11 | Discharge: 2019-10-11 | Disposition: A | Discharge status: Home / Self Care | Payer: Worker's Compensation | Attending: Emergency Medicine | Admitting: Emergency Medicine

## 2019-10-11 ENCOUNTER — Ambulatory Visit (INDEPENDENT_AMBULATORY_CARE_PROVIDER_SITE_OTHER): Payer: Worker's Compensation

## 2019-10-11 DIAGNOSIS — S63642A Sprain of metacarpophalangeal joint of left thumb, initial encounter: Secondary | ICD-10-CM

## 2019-10-11 DIAGNOSIS — S6992XA Unspecified injury of left wrist, hand and finger(s), initial encounter: Secondary | ICD-10-CM

## 2019-10-11 DIAGNOSIS — M79645 Pain in left finger(s): Secondary | ICD-10-CM | POA: Diagnosis not present

## 2019-10-11 DIAGNOSIS — M25432 Effusion, left wrist: Secondary | ICD-10-CM | POA: Diagnosis not present

## 2019-10-11 MED ORDER — NAPROXEN 500 MG PO TABS: 500.0000 mg | ORAL_TABLET | Freq: Two times a day (BID) | ORAL | 0 refills | Status: DC

## 2019-10-11 NOTE — Discharge Instructions (Signed)
Your xray is normal tonight which is reassuring.  Use of brace for comfort, do take it off and do range of motion with the finger intermittently to prevent freeze of the joint.  Ice application.  Naproxen twice a day, take with food.  Follow up with orthopedics if no improvement in the next two weeks.

## 2019-10-11 NOTE — ED Provider Notes (Signed)
[facility]    CSN: [id number] Arrival date & time: 10/11/19  1816      History   Chief Complaint Chief Complaint  Patient presents with  . Wrist Injury    HPI Stephanie Schroeder is a 39 y.o. female.   Stephanie Schroeder presents with complaints of Left wrist pain. 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 Now with pain to left thumb into the wrist. Making a fist or extension of the wrist and thumb hurts even more. Pain holding onto the steering wheel, for example. Occurred around 1600 today. No previous wrist or thumb injuries. Took ibuprofen which didn't help. No numbness or tingling. Right handed.   ROS per HPI, negative if not otherwise mentioned.      Past Medical History:  Diagnosis Date  . Bipolar 1 disorder (HCC)   . Depression   . ROM (rupture of membranes), premature 07/17/2013  . SVD (spontaneous vaginal delivery) 07/17/2013    Patient Active Problem List   Diagnosis Date Noted  . Lymphadenopathy 07/23/2018  . Healthcare maintenance 07/23/2018  . Left axillary pain 07/10/2018  . Breast pain, left 07/10/2018  . Screening breast examination 06/25/2018    Past Surgical History:  Procedure Laterality Date  . UPPER GI ENDOSCOPY      OB History    Gravida  1   Para  1   Term  1   Preterm      AB      Living  1     SAB      TAB      Ectopic      Multiple      Live Births  1            Home Medications    Prior to Admission medications   Medication Sig Start Date End Date Taking? Authorizing Provider  cetirizine (ZYRTEC) 10 MG tablet Take 10 mg by mouth daily.    [provider]  escitalopram (LEXAPRO) 10 MG tablet Take 1 tablet by mouth daily.    [provider]  levonorgestrel (MIRENA, 52 MG,) 20 MCG/24HR IUD Mirena 20 mcg/24 hours (6 yrs) 52 mg intrauterine device  Take 1 device by intrauterine route.    [provider]   naproxen (NAPROSYN) 500 MG tablet Take 1 tablet (500 mg total) by mouth 2 (two) times daily. 10/11/19   Georgetta Haber, NP    Family History Family History  Problem Relation Age of Onset  . Hypertension Mother   . Alcohol abuse Neg Hx   . Arthritis Neg Hx   . Asthma Neg Hx   . Birth defects Neg Hx   . Cancer Neg Hx   . COPD Neg Hx   . Depression Neg Hx   . Diabetes Neg Hx   . Drug abuse Neg Hx   . Early death Neg Hx   . Hearing loss Neg Hx   . Heart disease Neg Hx   . Hyperlipidemia Neg Hx   . Kidney disease Neg Hx   . Learning disabilities Neg Hx   . Mental illness Neg Hx   . Mental retardation Neg Hx   . Miscarriages / Stillbirths Neg Hx   . Stroke Neg Hx   . Vision loss Neg Hx   . Varicose Veins Neg Hx     Social History Social History   Tobacco Use  . Smoking status: Former Smoker  Packs/day: 0.50    Quit date: 07/17/2017    Years since quitting: 2.2  . Smokeless tobacco: Former Clinical biochemist  . Vaping Use: Never used  Substance Use Topics  . Alcohol use: No  . Drug use: No    Comment: History of cannibus use     Allergies   Risperidone and related   Review of Systems Review of Systems   Physical Exam Triage Vital Signs ED Triage Vitals [10/11/19 1948]  Enc Vitals Group     BP 129/81     Pulse Rate 69     Resp 18     Temp 98.4 F (36.9 C)     Temp Source Oral     SpO2 100 %     Weight 155 lb (70.3 kg)     Height 5\' 4"  (1.626 m)     Head Circumference      Peak Flow      Pain Score 7     Pain Loc      Pain Edu?      Excl. in GC?    No data found.  Updated Vital Signs BP 129/81   Pulse 69   Temp 98.4 F (36.9 C) (Oral)   Resp 18   Ht 5\' 4"  (1.626 m)   Wt 155 lb (70.3 kg)   SpO2 100%   BMI 26.61 kg/m   Visual Acuity Right Eye Distance:   Left Eye Distance:   Bilateral Distance:    Right Eye Near:   Left Eye Near:    Bilateral Near:     Physical Exam Constitutional:      General: She is not in acute  distress.    Appearance: She is well-developed.  Cardiovascular:     Rate and Rhythm: Normal rate.  Pulmonary:     Effort: Pulmonary effort is normal.  Musculoskeletal:     Left wrist: Tenderness, bony tenderness and snuff box tenderness present. No swelling, deformity, effusion or lacerations. Decreased range of motion.     Comments: Tenderness to left first metacarpal, to snuff box; pain with thumb opposition; strength in all directions intact with thumb and gross sensation intact   Skin:    General: Skin is warm and dry.  Neurological:     Mental Status: She is alert and oriented to person, place, and time.      UC Treatments / Results  Labs (all labs ordered are listed, but only abnormal results are displayed) Labs Reviewed - No data to display  EKG   Radiology DG Wrist Complete Left  Result Date: 10/11/2019 CLINICAL DATA:  Pain in left thumb and anatomic snuffbox after injury at work today, swelling EXAM: LEFT WRIST - COMPLETE 3+ VIEW COMPARISON:  None. FINDINGS: Frontal, oblique, lateral, and ulnar deviated views of the left wrist are obtained. No fracture, subluxation, or dislocation. There is joint space narrowing and osteophyte formation of the first carpometacarpal joint, compatible with osteoarthritis. Soft tissues are unremarkable. IMPRESSION: 1. Osteoarthritis of the first carpometacarpal joint. 2. No acute bony abnormality. Electronically Signed   By: M.D.   On: 10/11/2019 21:40    Procedures Procedures (including critical care time)  Medications Ordered in UC Medications - No data to display  Initial Impression / Assessment and Plan / UC Course  I have reviewed the triage vital signs and the nursing notes.  Pertinent labs & imaging results that were available during my care of the patient were reviewed by me and  considered in my medical decision making (see chart for details).     Xray without findings today. Tendonous injury likely based on  mechanism as well. Thumb spica provided, pain management discussed. Follow up recommendations provided. Patient verbalized understanding and agreeable to plan.   Final Clinical Impressions(s) / UC Diagnoses   Final diagnoses:  Sprain of metacarpophalangeal (MCP) joint of left thumb, initial encounter     Discharge Instructions     Your xray is normal tonight which is reassuring.  Use of brace for comfort, do take it off and do range of motion with the finger intermittently to prevent freeze of the joint.  Ice application.  Naproxen twice a day, take with food.  Follow up with orthopedics if no improvement in the next two weeks.     ED Prescriptions    Medication Sig Dispense Auth. Provider   naproxen (NAPROSYN) 500 MG tablet Take 1 tablet (500 mg total) by mouth 2 (two) times daily. 30 tablet Georgetta Haber, NP     PDMP not reviewed this encounter.   Georgetta Haber, NP 10/12/19 [phone]

## 2019-10-11 NOTE — ED Triage Notes (Signed)
Pt states she was doing a [employer] restraint and injured her left wrist from pt she was restraining pulling her arm. Pt c/o 7/10 sharp pain in left wrist. Pt has 2+ left radial pulse, cap refill less than 3 sec, 3/5 left grip strength. Pt states the pain shoots behind her thumb when she moves her wrist or thumb.

## 2020-05-19 IMAGING — [facility] DIGITAL DIAGNOSTIC UNILATERAL LEFT MAMMOGRAM WITH TOMO AND CAD
6 series · 6 of 18 positions shown · non-contrast
Comparison: Mammography 04/10/2018.

CLINICAL DATA: 37-year-old presenting with LEFT axillary swelling
and tenderness.

EXAM:
DIGITAL DIAGNOSTIC LEFT MAMMOGRAM WITH CAD AND TOMO
ULTRASOUND LEFT AXILLA

[L TAN synth-2D]
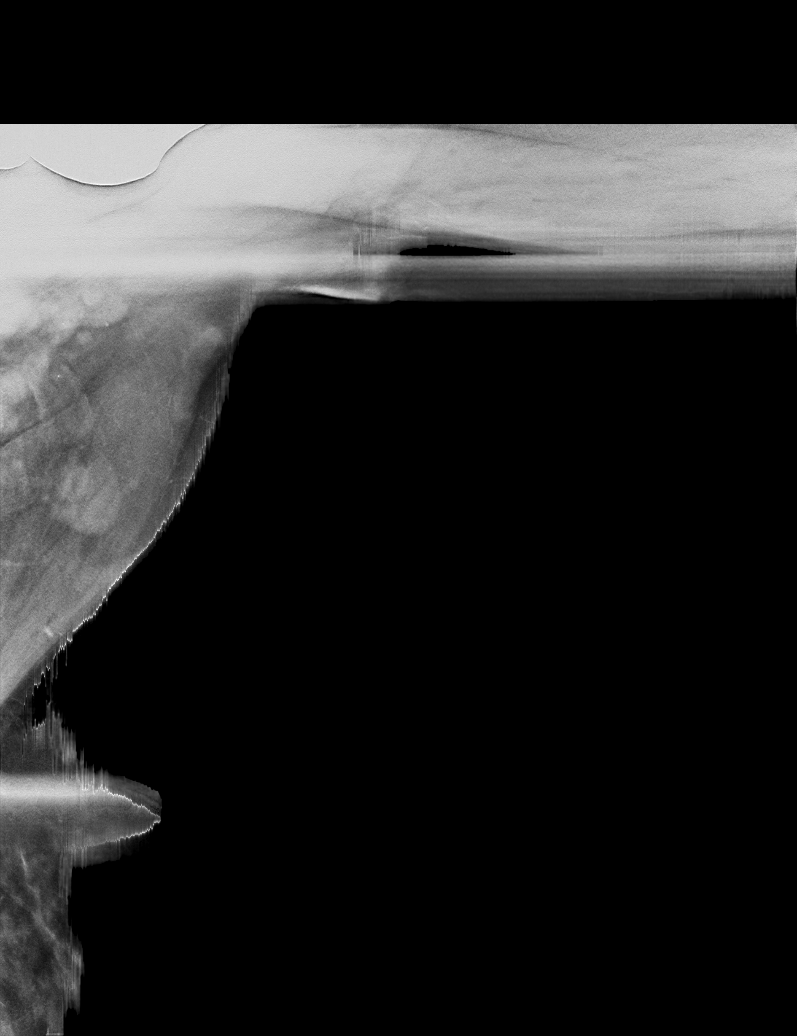

[L CC synth-2D]
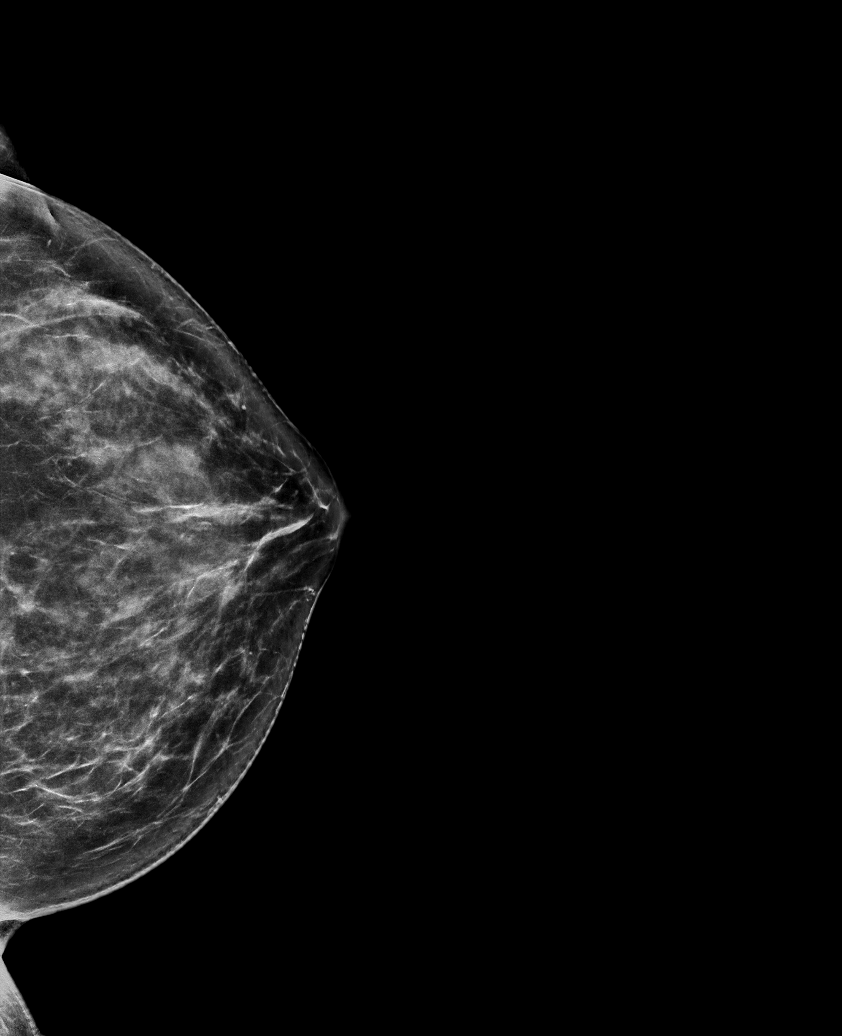

[L MLO synth-2D]
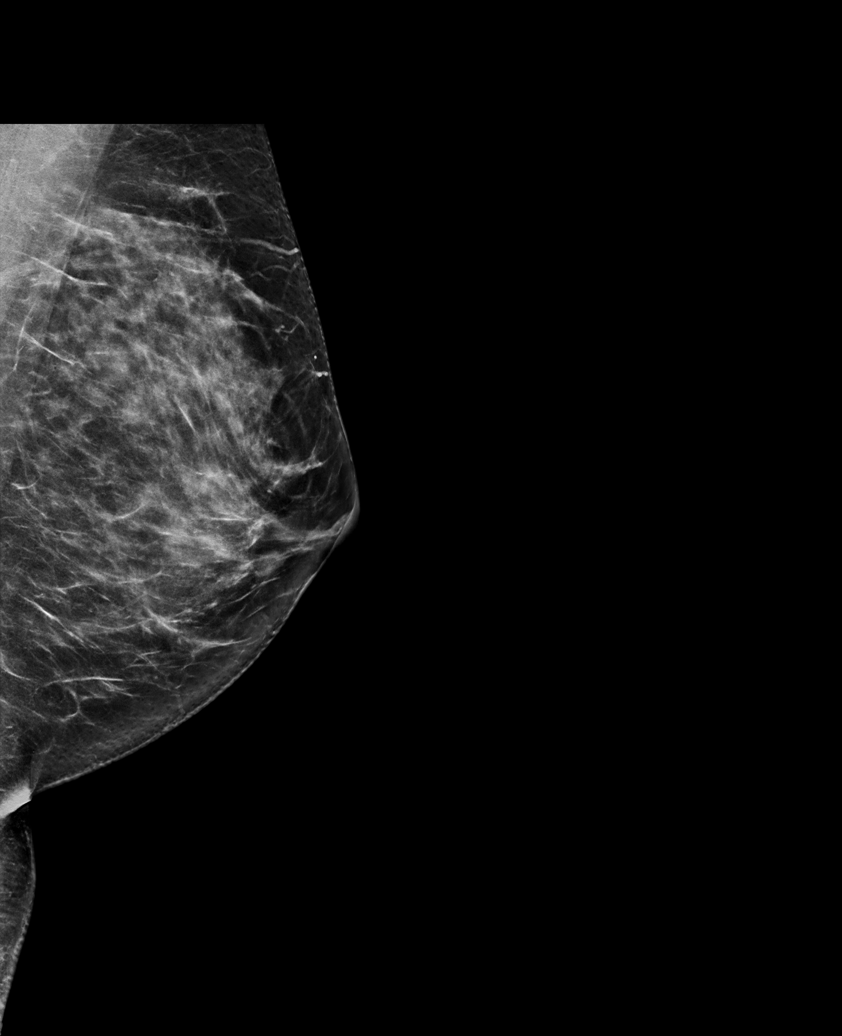

[L MLO tomo · tomo slice 39/77.0]
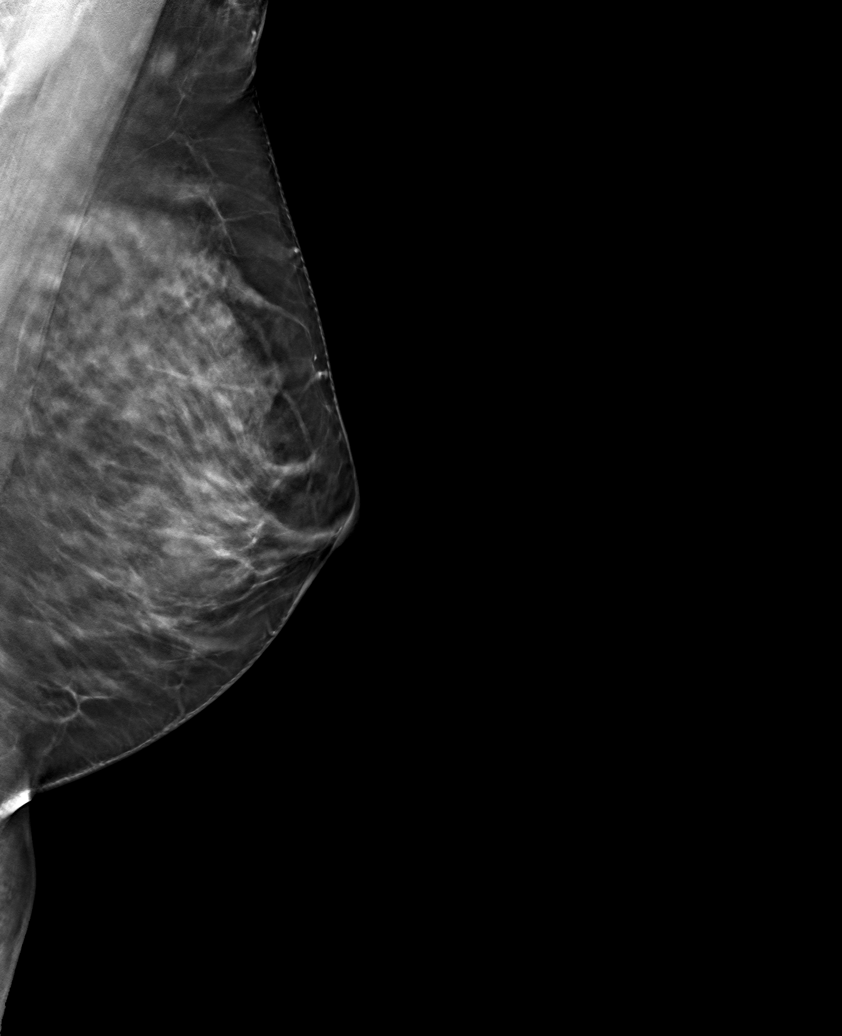

[L CC tomo · tomo slice 39/78.0]
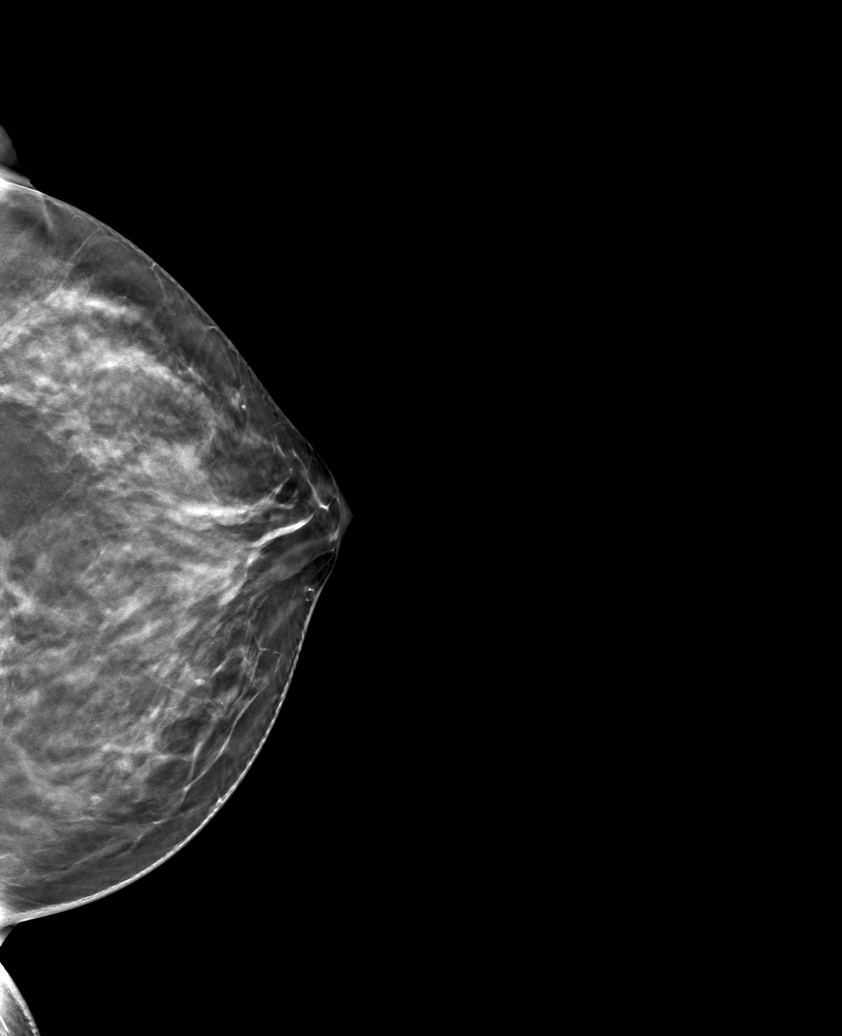

[L TAN tomo · tomo slice 49/96.0]
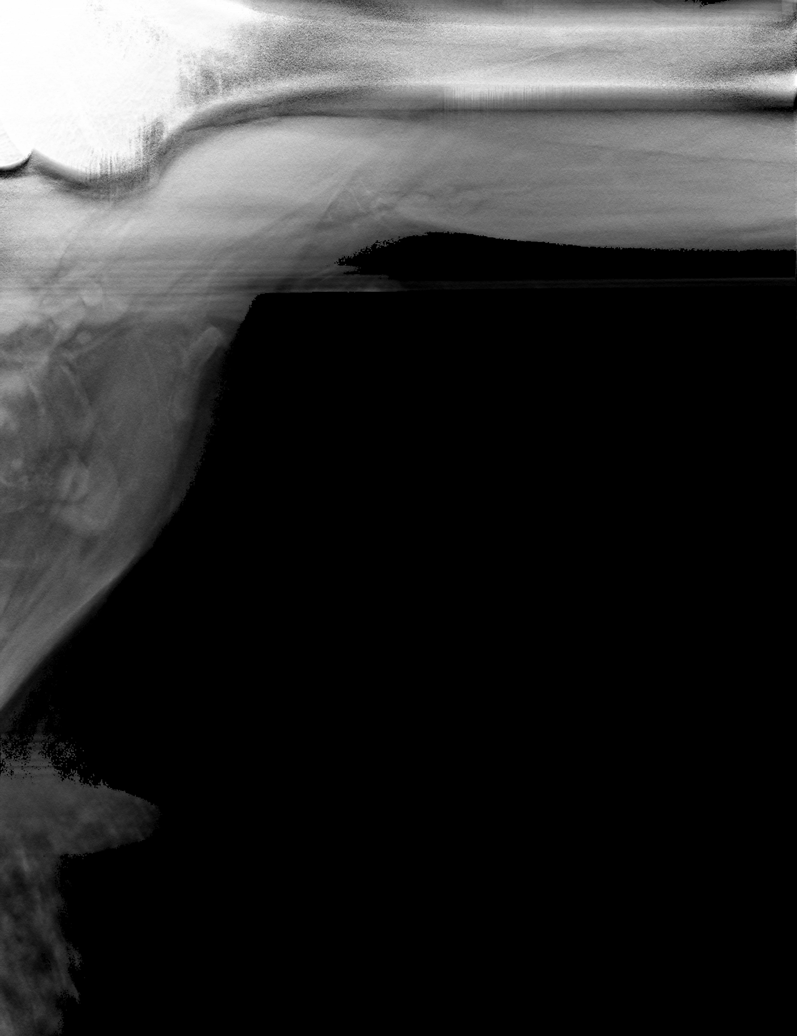

[6 of 18 positions shown; findings below may reference images not displayed]

BILATERAL Breast MRI
06/08/2018. No prior LEFT axillary ultrasound.

ACR Breast Density Category c: The breast tissue is heterogeneously
dense, which may obscure small masses.
FINDINGS: Tomosynthesis and synthesized full field CC and MLO views of the
LEFT breast were obtained. Tomosynthesis and synthesized spot
tangential view of the LEFT axilla was also obtained.

Multiple normal appearing lymph nodes in the LEFT axilla are
identified on the spot tangential view. No mass or pathologic
lymphadenopathy is identified.

No findings suspicious for malignancy in the LEFT breast.

Mammographic images were processed with CAD.

On correlative physical exam, there is asymmetric prominence of the
LEFT axillary fat pad, though I do not palpate a mass or pathologic
lymphadenopathy.

Targeted LEFT axillary ultrasound is performed, showing multiple
normal appearing lymph nodes. No mass or pathologic lymphadenopathy
is identified.
IMPRESSION: 1. No mammographic evidence of malignancy involving the LEFT breast.
2. No pathologic LEFT axillary lymphadenopathy.

RECOMMENDATION:
Screening mammogram at age 40 unless there are persistent or
intervening clinical concerns. (Code:[id number]-[id number])

I have discussed the findings and recommendations with the patient.
Results were also provided in writing at the conclusion of the
visit. If applicable, a reminder letter will be sent to the patient
regarding the next appointment.

BI-RADS CATEGORY  1: Negative.

## 2020-10-13 ENCOUNTER — Other Ambulatory Visit: Payer: Self-pay | Admitting: Obstetrics and Gynecology

## 2020-10-13 DIAGNOSIS — Z1231 Encounter for screening mammogram for malignant neoplasm of breast: Secondary | ICD-10-CM

## 2020-12-16 ENCOUNTER — Encounter: Payer: Self-pay | Admitting: Nurse Practitioner

## 2020-12-16 ENCOUNTER — Ambulatory Visit (INDEPENDENT_AMBULATORY_CARE_PROVIDER_SITE_OTHER): Payer: Self-pay | Admitting: Nurse Practitioner

## 2020-12-16 ENCOUNTER — Other Ambulatory Visit: Payer: Self-pay

## 2020-12-16 VITALS — BP 99/60 | HR 73 | Temp 98.5°F | Ht 64.0 in | Wt 136.4 lb

## 2020-12-16 DIAGNOSIS — R10817 Generalized abdominal tenderness: Secondary | ICD-10-CM

## 2020-12-16 NOTE — Progress Notes (Signed)
Acute Office Visit  Subjective:    Patient ID: Stephanie Schroeder, female    DOB: 18-Jul-1980, 40 y.o.   MRN: [id number]  Chief Complaint  Patient presents with   Abdominal Pain    Abdominal Pain This is a recurrent problem. The current episode started more than 1 month ago. The onset quality is gradual. The problem occurs every several days. The most recent episode lasted 4 hours. The problem has been waxing and waning. The pain is located in the generalized abdominal region and epigastric region. The pain is severe. The quality of the pain is burning and a sensation of fullness. Associated symptoms include anorexia, belching, constipation, flatus, myalgias, nausea and vomiting. Pertinent negatives include no arthralgias, diarrhea, dysuria, fever, frequency, headaches or melena. Exacerbated by: nothing can be pinpointed. The pain is relieved by Vomiting and certain positions. She has tried antacids for the symptoms. The treatment provided no relief. Prior diagnostic workup includes ultrasound. There is no history of abdominal surgery, gallstones, irritable bowel syndrome, pancreatitis or ulcerative colitis.    Past Medical History:  Diagnosis Date   Bipolar 1 disorder ([place])    Depression    ROM (rupture of membranes), premature 07/17/2013   SVD (spontaneous vaginal delivery) 07/17/2013    Past Surgical History:  Procedure Laterality Date   UPPER GI ENDOSCOPY      Family History  Problem Relation Age of Onset   Hypertension Mother    Alcohol abuse Neg Hx    Arthritis Neg Hx    Asthma Neg Hx    Birth defects Neg Hx    Cancer Neg Hx    COPD Neg Hx    Depression Neg Hx    Diabetes Neg Hx    Drug abuse Neg Hx    Early death Neg Hx    Hearing loss Neg Hx    Heart disease Neg Hx    Hyperlipidemia Neg Hx    Kidney disease Neg Hx    Learning disabilities Neg Hx    Mental illness Neg Hx    Mental retardation Neg Hx    Miscarriages / Stillbirths Neg Hx    Stroke Neg Hx    Vision  loss Neg Hx    Varicose Veins Neg Hx     Social History   Socioeconomic History   Marital status: Single    Spouse name: Not on file   Number of children: 1   Years of education: Not on file   Highest education level: Bachelor's degree (e.g., BA, AB, BS)  Occupational History   Not on file  Tobacco Use   Smoking status: Former    Packs/day: 0.50    Types: Cigarettes    Quit date: 07/17/2017    Years since quitting: 3.4   Smokeless tobacco: Former  Scientific laboratory technician Use: Never used  Substance and Sexual Activity   Alcohol use: No   Drug use: No    Comment: History of cannibus use   Sexual activity: Yes    Birth control/protection: I.U.D.  Other Topics Concern   Not on file  Social History Narrative   Not on file   Social Determinants of Health   Financial Resource Strain: Not on file  Food Insecurity: Not on file  Transportation Needs: Not on file  Physical Activity: Not on file  Stress: Not on file  Social Connections: Not on file  Intimate Partner Violence: Not on file    Outpatient Medications Prior to Visit  Medication Sig  Dispense Refill   cetirizine (ZYRTEC) 10 MG tablet Take 10 mg by mouth daily.     escitalopram (LEXAPRO) 10 MG tablet Take 1 tablet by mouth daily.     levonorgestrel (MIRENA, 52 MG,) 20 MCG/24HR IUD Mirena 20 mcg/24 hours (6 yrs) 52 mg intrauterine device  Take 1 device by intrauterine route.     naproxen (NAPROSYN) 500 MG tablet Take 1 tablet (500 mg total) by mouth 2 (two) times daily. 30 tablet 0   No facility-administered medications prior to visit.    Allergies  Allergen Reactions   Risperidone And Related Swelling    Tongue swelling   Hydrocodone Nausea And Vomiting    Review of Systems  Constitutional:  Positive for appetite change and fatigue. Negative for activity change, chills and fever.  HENT:  Negative for congestion, postnasal drip, rhinorrhea, sinus pressure, sinus pain, sneezing and sore throat.   Eyes:  Negative.   Respiratory:  Negative for cough, chest tightness, shortness of breath and wheezing.   Cardiovascular:  Negative for chest pain and palpitations.  Gastrointestinal:  Positive for abdominal pain, anorexia, constipation, flatus, nausea and vomiting. Negative for diarrhea and melena.  Endocrine: Negative for cold intolerance, heat intolerance, polydipsia and polyuria.  Genitourinary:  Negative for dyspareunia, dysuria, flank pain, frequency and urgency.  Musculoskeletal:  Positive for myalgias. Negative for arthralgias and back pain.  Skin:  Negative for rash.  Allergic/Immunologic: Negative for environmental allergies.  Neurological:  Negative for dizziness, weakness and headaches.  Hematological:  Negative for adenopathy.  Psychiatric/Behavioral:  The patient is not nervous/anxious.       Objective:    Physical Exam Vitals and nursing note reviewed.  Constitutional:      Appearance: Normal appearance. She is well-developed.  HENT:     Head: Normocephalic and atraumatic.  Eyes:     Pupils: Pupils are equal, round, and reactive to light.  Cardiovascular:     Rate and Rhythm: Normal rate and regular rhythm.     Pulses: Normal pulses.     Heart sounds: Normal heart sounds.  Pulmonary:     Effort: Pulmonary effort is normal.     Breath sounds: Normal breath sounds.  Abdominal:     General: Abdomen is flat. Bowel sounds are normal. There is no distension.     Palpations: Abdomen is soft. There is no shifting dullness, fluid wave, hepatomegaly, splenomegaly, mass or pulsatile mass.     Tenderness: There is generalized abdominal tenderness.     Hernia: No hernia is present.  Musculoskeletal:        General: Normal range of motion.     Cervical back: Normal range of motion and neck supple.  Lymphadenopathy:     Cervical: No cervical adenopathy.  Skin:    General: Skin is warm and dry.     Capillary Refill: Capillary refill takes less than 2 seconds.  Neurological:      General: No focal deficit present.     Mental Status: She is alert and oriented to person, place, and time.  Psychiatric:        Mood and Affect: Mood normal.        Behavior: Behavior normal.        Thought Content: Thought content normal.        Judgment: Judgment normal.    Today's Vitals   12/16/20 0952  BP: 99/60  Pulse: 73  Temp: 98.5 F (36.9 C)  SpO2: 97%  Weight: 136 lb 6.4 oz (61.9 kg)  Height: 5\' 4"  (1.626 m)   Body mass index is 23.41 kg/m.   Wt Readings from Last 3 Encounters:  12/16/20 136 lb 6.4 oz (61.9 kg)  10/11/19 155 lb (70.3 kg)  01/22/19 156 lb 9.6 oz (71 kg)    Health Maintenance Due  Topic Date Due   COVID-19 Vaccine (1) Never done   Hepatitis C Screening  Never done   INFLUENZA VACCINE  Never done    There are no preventive care reminders to display for this patient.   Lab Results  Component Value Date   TSH 1.640 01/15/2019   Lab Results  Component Value Date   WBC 11.0 (H) 01/15/2019   HGB 14.0 01/15/2019   HCT 41.8 01/15/2019   MCV 93 01/15/2019   PLT 393 01/15/2019   Lab Results  Component Value Date   NA 139 01/15/2019   K 5.2 01/15/2019   CHLORIDE 105 08/19/2013   CO2 23 01/15/2019   GLUCOSE 89 01/15/2019   BUN 10 01/15/2019   CREATININE 0.64 01/15/2019   BILITOT 0.3 01/15/2019   ALKPHOS 75 01/15/2019   AST 8 01/15/2019   ALT 11 01/15/2019   PROT 6.6 01/15/2019   ALBUMIN 4.4 01/15/2019   CALCIUM 9.6 01/15/2019   ANIONGAP 8 08/19/2013   Lab Results  Component Value Date   CHOL 148 01/15/2019   Lab Results  Component Value Date   HDL 36 (L) 01/15/2019   Lab Results  Component Value Date   LDLCALC 98 01/15/2019   Lab Results  Component Value Date   TRIG 72 01/15/2019   Lab Results  Component Value Date   CHOLHDL 4.1 01/15/2019   Lab Results  Component Value Date   HGBA1C 5.1 01/15/2019       Assessment & Plan:  1. Generalized abdominal tenderness without rebound tenderness Suspect IBS with  constipation or problems with gall bladder. Will get ultrasound of the abdomen for further evaluation.  - US Abdomen Complete; Future   Problem List Items Addressed This Visit   None Visit Diagnoses     Generalized abdominal tenderness without rebound tenderness    -  Primary   Relevant Orders   US Abdomen Complete        Ronnell Freshwater, NP

## 2020-12-21 ENCOUNTER — Emergency Department (HOSPITAL_COMMUNITY): Payer: No Typology Code available for payment source

## 2020-12-21 ENCOUNTER — Encounter (HOSPITAL_COMMUNITY): Payer: Self-pay

## 2020-12-21 ENCOUNTER — Emergency Department (HOSPITAL_COMMUNITY)
Admission: EM | Admit: 2020-12-21 | Discharge: 2020-12-22 | Disposition: A | Discharge status: Home / Self Care | Payer: No Typology Code available for payment source | Attending: Emergency Medicine | Admitting: Emergency Medicine

## 2020-12-21 ENCOUNTER — Telehealth: Payer: Self-pay | Admitting: Nurse Practitioner

## 2020-12-21 ENCOUNTER — Other Ambulatory Visit: Payer: Self-pay

## 2020-12-21 DIAGNOSIS — R1031 Right lower quadrant pain: Secondary | ICD-10-CM | POA: Diagnosis present

## 2020-12-21 DIAGNOSIS — N9489 Other specified conditions associated with female genital organs and menstrual cycle: Secondary | ICD-10-CM | POA: Insufficient documentation

## 2020-12-21 DIAGNOSIS — Z87891 Personal history of nicotine dependence: Secondary | ICD-10-CM | POA: Insufficient documentation

## 2020-12-21 DIAGNOSIS — D72829 Elevated white blood cell count, unspecified: Secondary | ICD-10-CM | POA: Diagnosis not present

## 2020-12-21 DIAGNOSIS — K8020 Calculus of gallbladder without cholecystitis without obstruction: Secondary | ICD-10-CM | POA: Diagnosis not present

## 2020-12-21 LAB — URINALYSIS, ROUTINE W REFLEX MICROSCOPIC
Bacteria, UA: NONE SEEN
Bilirubin Urine: NEGATIVE
Glucose, UA: NEGATIVE mg/dL
Hgb urine dipstick: NEGATIVE
Ketones, ur: 5 mg/dL — AB
Nitrite: NEGATIVE
Protein, ur: NEGATIVE mg/dL
Specific Gravity, Urine: >1.046 — ABNORMAL HIGH (ref 1.005–1.030)
pH: 5 (ref 5.0–8.0)

## 2020-12-21 LAB — CBC
HCT: 45.6 % (ref 36.0–46.0)
Hemoglobin: 15.1 g/dL — ABNORMAL HIGH (ref 12.0–15.0)
MCH: 31.2 pg (ref 26.0–34.0)
MCHC: 33.1 g/dL (ref 30.0–36.0)
MCV: 94.2 fL (ref 80.0–100.0)
Platelets: 380 10*3/uL (ref 150–400)
RBC: 4.84 MIL/uL (ref 3.87–5.11)
RDW: 13 % (ref 11.5–15.5)
WBC: 13.9 10*3/uL — ABNORMAL HIGH (ref 4.0–10.5)
nRBC: 0 % (ref 0.0–0.2)

## 2020-12-21 LAB — LIPASE, BLOOD: Lipase: 29 U/L (ref 11–51)

## 2020-12-21 LAB — COMPREHENSIVE METABOLIC PANEL
ALT: 13 U/L (ref 0–44)
AST: 14 U/L — ABNORMAL LOW (ref 15–41)
Albumin: 4.3 g/dL (ref 3.5–5.0)
Alkaline Phosphatase: 50 U/L (ref 38–126)
Anion gap: 7 (ref 5–15)
BUN: 13 mg/dL (ref 6–20)
CO2: 23 mmol/L (ref 22–32)
Calcium: 9 mg/dL (ref 8.9–10.3)
Chloride: 107 mmol/L (ref 98–111)
Creatinine, Ser: 0.6 mg/dL (ref 0.44–1.00)
GFR, Estimated: >60 mL/min (ref >60)
Glucose, Bld: 90 mg/dL (ref 70–99)
Potassium: 3.7 mmol/L (ref 3.5–5.1)
Sodium: 137 mmol/L (ref 135–145)
Total Bilirubin: 0.6 mg/dL (ref 0.3–1.2)
Total Protein: 7.4 g/dL (ref 6.5–8.1)

## 2020-12-21 LAB — HCG, QUANTITATIVE, PREGNANCY: hCG, Beta Chain, Quant, S: <1 m[IU]/mL (ref <5)

## 2020-12-21 MED ORDER — ONDANSETRON HCL 4 MG/2ML IJ SOLN
4.0000 mg | Freq: Once | INTRAMUSCULAR | Status: AC
  Administered 2020-12-22: 4 mg via INTRAVENOUS

## 2020-12-21 MED ORDER — IOHEXOL 350 MG/ML SOLN: 80.0000 mL | Freq: Once | INTRAVENOUS | Status: DC | PRN

## 2020-12-21 MED ORDER — FENTANYL CITRATE PF 50 MCG/ML IJ SOSY
50.0000 ug | PREFILLED_SYRINGE | Freq: Once | INTRAMUSCULAR | Status: AC
  Administered 2020-12-22: 50 ug via INTRAVENOUS

## 2020-12-21 MED ORDER — SODIUM CHLORIDE 0.9 % IV BOLUS
1000.0000 mL | Freq: Once | INTRAVENOUS | Status: AC
  Administered 2020-12-22: 1000 mL via INTRAVENOUS

## 2020-12-21 MED ORDER — IOHEXOL 350 MG/ML SOLN
80.0000 mL | Freq: Once | INTRAVENOUS | Status: AC | PRN
  Administered 2020-12-21: 23:00:00 80 mL via INTRAVENOUS

## 2020-12-21 NOTE — ED Provider Notes (Signed)
[facility]-EMERGENCY DEPT Provider Note   CSN: [id number] Arrival date & time: 12/21/20  1748     History Chief Complaint  Patient presents with   Abdominal Pain    Stephanie Schroeder is a 40 y.o. female.  Patient presents to the emergency department with a chief complaint of right-sided abdominal pain.  She reports having intermittent symptoms over the past couple of months, but states that the pain significantly worsened today.  She denies any nausea or vomiting today.  States that she has been drinking "smoothies" all week.  She states that her doctor told her she would likely need an ultrasound to look at her gallbladder, but this not scheduled until next week.  She reports 8 out of 10 pain.  States pain radiates to her right lower abdomen.  Denies any urinary symptoms.  The history is provided by the patient. No language interpreter was used.      Past Medical History:  Diagnosis Date   Bipolar 1 disorder ([facility])    Depression    ROM (rupture of membranes), premature 07/17/2013   SVD (spontaneous vaginal delivery) 07/17/2013    Patient Active Problem List   Diagnosis Date Noted   Lymphadenopathy 07/23/2018   Healthcare maintenance 07/23/2018   Left axillary pain 07/10/2018   Breast pain, left 07/10/2018   Screening breast examination 06/25/2018    Past Surgical History:  Procedure Laterality Date   UPPER GI ENDOSCOPY       OB History     Gravida  1   Para  1   Term  1   Preterm      AB      Living  1      SAB      IAB      Ectopic      Multiple      Live Births  1           Family History  Problem Relation Age of Onset   Hypertension Mother    Alcohol abuse Neg Hx    Arthritis Neg Hx    Asthma Neg Hx    Birth defects Neg Hx    Cancer Neg Hx    COPD Neg Hx    Depression Neg Hx    Diabetes Neg Hx    Drug abuse Neg Hx    Early death Neg Hx    Hearing loss Neg Hx    Heart disease Neg Hx    Hyperlipidemia Neg Hx     Kidney disease Neg Hx    Learning disabilities Neg Hx    Mental illness Neg Hx    Mental retardation Neg Hx    Miscarriages / Stillbirths Neg Hx    Stroke Neg Hx    Vision loss Neg Hx    Varicose Veins Neg Hx     Social History   Tobacco Use   Smoking status: Former    Packs/day: 0.50    Types: Cigarettes    Quit date: 07/17/2017    Years since quitting: 3.4   Smokeless tobacco: Former  Building services engineer Use: Never used  Substance Use Topics   Alcohol use: No   Drug use: No    Comment: History of cannibus use    Home Medications Prior to Admission medications   Medication Sig Start Date End Date Taking? Authorizing Provider  cetirizine (ZYRTEC) 10 MG tablet Take 10 mg by mouth daily.    [provider]  escitalopram (  LEXAPRO) 10 MG tablet Take 1 tablet by mouth daily.    [provider]    Allergies    Risperidone and related and Hydrocodone  Review of Systems   Review of Systems  All other systems reviewed and are negative.  Physical Exam Updated Vital Signs BP 101/66   Pulse 80   Temp 98.6 F (37 C) (Oral)   Resp 16   Ht 5\' 4"  (1.626 m)   Wt 61.7 kg   LMP 10/26/2020 (Approximate)   SpO2 100%   BMI 23.34 kg/m   Physical Exam Vitals and nursing note reviewed.  Constitutional:      General: She is not in acute distress.    Appearance: She is well-developed.  HENT:     Head: Normocephalic and atraumatic.  Eyes:     Conjunctiva/sclera: Conjunctivae normal.  Cardiovascular:     Rate and Rhythm: Normal rate and regular rhythm.     Heart sounds: No murmur heard. Pulmonary:     Effort: Pulmonary effort is normal. No respiratory distress.     Breath sounds: Normal breath sounds.  Abdominal:     Palpations: Abdomen is soft.     Tenderness: There is abdominal tenderness.     Comments: RLQ TTP  Musculoskeletal:        General: No swelling.     Cervical back: Neck supple.  Skin:    General: Skin is warm and dry.     Capillary  Refill: Capillary refill takes less than 2 seconds.  Neurological:     Mental Status: She is alert and oriented to person, place, and time.  Psychiatric:        Mood and Affect: Mood normal.    ED Results / Procedures / Treatments   Labs (all labs ordered are listed, but only abnormal results are displayed) Labs Reviewed  COMPREHENSIVE METABOLIC PANEL - Abnormal; Notable for the following components:      Result Value   AST 14 (*)    All other components within normal limits  CBC - Abnormal; Notable for the following components:   WBC 13.9 (*)    Hemoglobin 15.1 (*)    All other components within normal limits  LIPASE, BLOOD  HCG, QUANTITATIVE, PREGNANCY  URINALYSIS, ROUTINE W REFLEX MICROSCOPIC    EKG None  Radiology No results found.  Procedures Procedures   Medications Ordered in ED Medications  iohexol (OMNIPAQUE) 350 MG/ML injection 80 mL ( Intravenous Canceled Entry 12/21/20 2242)  fentaNYL (SUBLIMAZE) injection 50 mcg (has no administration in time range)  ondansetron (ZOFRAN) injection 4 mg (has no administration in time range)  sodium chloride 0.9 % bolus 1,000 mL (has no administration in time range)  iohexol (OMNIPAQUE) 350 MG/ML injection 80 mL (80 mLs Intravenous Contrast Given 12/21/20 2242)    ED Course  I have reviewed the triage vital signs and the nursing notes.  Pertinent labs & imaging results that were available during my care of the patient were reviewed by me and considered in my medical decision making (see chart for details).    MDM Rules/Calculators/A&P                           Patient here with right-sided lower abdominal pain.  Has been having intermittent symptoms for a couple of months, but pain worsened today.  She states the pain had been generalized all over her abdomen, but today the pain located in the RLQ today.  CT ordered in triage.  Mild leukocytosis noted in triage labs, WBC is 13.9.  Pregnancy test negative.  Urinalysis  pending.  Electrolytes are reassuring.  Lipase is 29.  CT showed findings consistent with enteritis, but patient does not have symptoms of enteritis.  Ultrasound is ordered for further evaluation.  Ultrasound shows cholelithiasis, but without evidence of cholecystitis.  There was a small lesion seen on the liver on the CT, which seems most consistent with a hemangioma.  I have advised the patient that she will need to discuss this with her doctor.  I have recommended that the patient follow-up with general surgery.  She will call and make an appointment.  We will treat pain with Percocet.  Return precautions discussed. Final Clinical Impression(s) / ED Diagnoses Final diagnoses:  Calculus of gallbladder without cholecystitis without obstruction    Rx / DC Orders ED Discharge Orders     None        Roxy Horseman, PA-C 12/22/20 0045    Tilden Fossa, MD 12/22/20 [phone]

## 2020-12-21 NOTE — ED Provider Notes (Signed)
Emergency Medicine Provider Triage Evaluation Note  Stephanie Schroeder , a 40 y.o. female  was evaluated in triage.  Pt complains of abdominal pain x2 months worsened over the past several days.  Patient was waiting to have an ultrasound done but called her PCP who referred her to the emergency room  Review of Systems  Positive: Abdominal pain, nausea Negative: Fever, vomiting  Physical Exam  BP 122/69 (BP Location: Left Arm)   Pulse 78   Temp 98.6 F (37 C) (Oral)   Resp 16   Ht 5\' 4"  (1.626 m)   Wt 61.7 kg   LMP 10/26/2020 (Approximate)   SpO2 100%   BMI 23.34 kg/m  Gen:   Awake, no distress   Resp:  Normal effort  MSK:   Moves extremities without difficulty  Other:  Generalized abdominal tenderness  Medical Decision Making  Medically screening exam initiated at 7:32 PM.  Appropriate orders placed.  Stephanie Schroeder was informed that the remainder of the evaluation will be completed by another provider, this initial triage assessment does not replace that evaluation, and the importance of remaining in the ED until their evaluation is complete.     Wynetta Emery, PA-C 12/21/20 1933    12/23/20, MD 12/22/20 12/24/20

## 2020-12-21 NOTE — ED Triage Notes (Signed)
Patient c/o intermittent abdominal pain x 2 months, but worsened approx 2 weeks ago. Nausea today only.  Patient states the pain has been mostly RLQ, but today "all over."

## 2020-12-21 NOTE — Telephone Encounter (Signed)
Spoke with patient and advised her the recommendations.

## 2020-12-21 NOTE — Telephone Encounter (Signed)
Patient came in last week and seen you for some digestive issues and is having an ultrasound done next week however she has started experiencing some heavy pain on the right side and is concerned if she needs to go to the hospital. Please advise [phone]

## 2020-12-21 NOTE — Telephone Encounter (Signed)
If she is having heavy pain along with other symptoms, she should be seen in ER due to risk of appendicitis or ovarian cyst.

## 2020-12-22 MED ORDER — FENTANYL CITRATE PF 50 MCG/ML IJ SOSY
PREFILLED_SYRINGE | INTRAMUSCULAR | Status: AC
  Filled 2020-12-22: qty 1

## 2020-12-22 MED ORDER — ONDANSETRON HCL 4 MG/2ML IJ SOLN
INTRAMUSCULAR | Status: AC
  Filled 2020-12-22: qty 2

## 2020-12-22 MED ORDER — ONDANSETRON 4 MG PO TBDP: 4.0000 mg | ORAL_TABLET | Freq: Three times a day (TID) | ORAL | 0 refills | Status: DC | PRN

## 2020-12-22 MED ORDER — OXYCODONE-ACETAMINOPHEN 5-325 MG PO TABS: 1.0000 | ORAL_TABLET | Freq: Four times a day (QID) | ORAL | 0 refills | Status: DC | PRN

## 2020-12-22 NOTE — Discharge Instructions (Addendum)
The CT scan showed evidence of enteritis, but you are not having symptoms consistent with those.  It also saw a small mass on your liver, which was further characterized on the ultrasound and seems consistent with a hemangioma.  These are benign, but you should notify your doctor of this for monitoring.  Your ultrasound was consistent with gallstones within the gallbladder.  I suspect this is the cause of your pain.  We will treat your pain with Percocet at home.  You need to call the general surgery clinic and schedule an appointment.  If your symptoms change or worsen, including fever, persistent vomiting, or worsening pain, return to the emergency department.  You will likely need to have your gallbladder taken out at a future point in time.

## 2020-12-23 ENCOUNTER — Telehealth: Payer: Self-pay | Admitting: Nurse Practitioner

## 2020-12-23 NOTE — Telephone Encounter (Signed)
Pt is aware and will call to schedule after seeing surgeon.

## 2020-12-23 NOTE — Telephone Encounter (Signed)
I thik it would be best and give Stephanie Schroeder the most information if she see surgery first. Thanks

## 2020-12-23 NOTE — Telephone Encounter (Signed)
Patient called to let you know that she did end up going to the ER for the abdominal pain and it indeed is her gallbladder. Consultation for surgery is on 12/7 but on her discharge summary it says to follow up with PCP. Should she wait until after consult or make an appointment with you this week? [phone]

## 2020-12-29 ENCOUNTER — Other Ambulatory Visit: Payer: Self-pay

## 2021-01-04 ENCOUNTER — Observation Stay (HOSPITAL_COMMUNITY)
Admission: EM | Admit: 2021-01-04 | Discharge: 2021-01-06 | Disposition: A | Discharge status: Home / Self Care | Payer: No Typology Code available for payment source | Attending: Emergency Medicine | Admitting: Emergency Medicine

## 2021-01-04 ENCOUNTER — Other Ambulatory Visit: Payer: Self-pay

## 2021-01-04 ENCOUNTER — Encounter (HOSPITAL_COMMUNITY): Payer: Self-pay

## 2021-01-04 ENCOUNTER — Emergency Department (HOSPITAL_COMMUNITY): Payer: No Typology Code available for payment source

## 2021-01-04 DIAGNOSIS — K8000 Calculus of gallbladder with acute cholecystitis without obstruction: Secondary | ICD-10-CM | POA: Diagnosis not present

## 2021-01-04 DIAGNOSIS — R1013 Epigastric pain: Secondary | ICD-10-CM | POA: Diagnosis present

## 2021-01-04 DIAGNOSIS — Z20822 Contact with and (suspected) exposure to covid-19: Secondary | ICD-10-CM | POA: Insufficient documentation

## 2021-01-04 DIAGNOSIS — Z87891 Personal history of nicotine dependence: Secondary | ICD-10-CM | POA: Diagnosis not present

## 2021-01-04 DIAGNOSIS — Z419 Encounter for procedure for purposes other than remedying health state, unspecified: Secondary | ICD-10-CM

## 2021-01-04 DIAGNOSIS — K8020 Calculus of gallbladder without cholecystitis without obstruction: Secondary | ICD-10-CM

## 2021-01-04 HISTORY — DX: Calculus of gallbladder without cholecystitis without obstruction: K80.20

## 2021-01-04 LAB — COMPREHENSIVE METABOLIC PANEL
ALT: 12 U/L (ref 0–44)
AST: 12 U/L — ABNORMAL LOW (ref 15–41)
Albumin: 4.3 g/dL (ref 3.5–5.0)
Alkaline Phosphatase: 50 U/L (ref 38–126)
Anion gap: 6 (ref 5–15)
BUN: 10 mg/dL (ref 6–20)
CO2: 26 mmol/L (ref 22–32)
Calcium: 9.4 mg/dL (ref 8.9–10.3)
Chloride: 107 mmol/L (ref 98–111)
Creatinine, Ser: 0.59 mg/dL (ref 0.44–1.00)
GFR, Estimated: >60 mL/min (ref >60)
Glucose, Bld: 91 mg/dL (ref 70–99)
Potassium: 4.5 mmol/L (ref 3.5–5.1)
Sodium: 139 mmol/L (ref 135–145)
Total Bilirubin: 0.3 mg/dL (ref 0.3–1.2)
Total Protein: 7 g/dL (ref 6.5–8.1)

## 2021-01-04 LAB — CBC WITH DIFFERENTIAL/PLATELET
Abs Immature Granulocytes: 0.13 10*3/uL — ABNORMAL HIGH (ref 0.00–0.07)
Basophils Absolute: 0.1 10*3/uL (ref 0.0–0.1)
Basophils Relative: 1 %
Eosinophils Absolute: 0.1 10*3/uL (ref 0.0–0.5)
Eosinophils Relative: 1 %
HCT: 44.9 % (ref 36.0–46.0)
Hemoglobin: 14.9 g/dL (ref 12.0–15.0)
Immature Granulocytes: 1 %
Lymphocytes Relative: 28 %
Lymphs Abs: 3.4 10*3/uL (ref 0.7–4.0)
MCH: 31.4 pg (ref 26.0–34.0)
MCHC: 33.2 g/dL (ref 30.0–36.0)
MCV: 94.7 fL (ref 80.0–100.0)
Monocytes Absolute: 0.7 10*3/uL (ref 0.1–1.0)
Monocytes Relative: 6 %
Neutro Abs: 7.6 10*3/uL (ref 1.7–7.7)
Neutrophils Relative %: 63 %
Platelets: 373 10*3/uL (ref 150–400)
RBC: 4.74 MIL/uL (ref 3.87–5.11)
RDW: 12.9 % (ref 11.5–15.5)
WBC: 12.1 10*3/uL — ABNORMAL HIGH (ref 4.0–10.5)
nRBC: 0 % (ref 0.0–0.2)

## 2021-01-04 LAB — CBC
HCT: 43.1 % (ref 36.0–46.0)
Hemoglobin: 14.1 g/dL (ref 12.0–15.0)
MCH: 31.3 pg (ref 26.0–34.0)
MCHC: 32.7 g/dL (ref 30.0–36.0)
MCV: 95.6 fL (ref 80.0–100.0)
Platelets: 363 10*3/uL (ref 150–400)
RBC: 4.51 MIL/uL (ref 3.87–5.11)
RDW: 13.1 % (ref 11.5–15.5)
WBC: 11.2 10*3/uL — ABNORMAL HIGH (ref 4.0–10.5)
nRBC: 0 % (ref 0.0–0.2)

## 2021-01-04 LAB — CREATININE, SERUM
Creatinine, Ser: 0.65 mg/dL (ref 0.44–1.00)
GFR, Estimated: >60 mL/min (ref >60)

## 2021-01-04 LAB — LIPASE, BLOOD: Lipase: 28 U/L (ref 11–51)

## 2021-01-04 LAB — I-STAT BETA HCG BLOOD, ED (MC, WL, AP ONLY): I-stat hCG, quantitative: <5 m[IU]/mL (ref <5)

## 2021-01-04 MED ORDER — SODIUM CHLORIDE 0.9 % IV SOLN
2.0000 g | INTRAVENOUS | Status: DC
  Administered 2021-01-04 – 2021-01-05 (×2): 2 g via INTRAVENOUS
  Filled 2021-01-04 (×2): qty 20

## 2021-01-04 MED ORDER — DIPHENHYDRAMINE HCL 12.5 MG/5ML PO ELIX: 12.5000 mg | ORAL_SOLUTION | Freq: Four times a day (QID) | ORAL | Status: DC | PRN

## 2021-01-04 MED ORDER — ESCITALOPRAM OXALATE 20 MG PO TABS
40.0000 mg | ORAL_TABLET | Freq: Every day | ORAL | Status: DC
  Administered 2021-01-05 – 2021-01-06 (×2): 40 mg via ORAL
  Filled 2021-01-04 (×2): qty 2

## 2021-01-04 MED ORDER — OXYCODONE HCL 5 MG PO TABS
5.0000 mg | ORAL_TABLET | ORAL | Status: DC | PRN
  Administered 2021-01-05 – 2021-01-06 (×3): 10 mg via ORAL
  Filled 2021-01-04 (×3): qty 2

## 2021-01-04 MED ORDER — MELATONIN 3 MG PO TABS: 3.0000 mg | ORAL_TABLET | Freq: Every evening | ORAL | Status: DC | PRN

## 2021-01-04 MED ORDER — ACETAMINOPHEN 650 MG RE SUPP: 650.0000 mg | Freq: Four times a day (QID) | RECTAL | Status: DC | PRN

## 2021-01-04 MED ORDER — ONDANSETRON HCL 4 MG/2ML IJ SOLN
4.0000 mg | Freq: Once | INTRAMUSCULAR | Status: AC
  Administered 2021-01-04: 4 mg via INTRAVENOUS
  Filled 2021-01-04: qty 2

## 2021-01-04 MED ORDER — ACETAMINOPHEN 325 MG PO TABS: 650.0000 mg | ORAL_TABLET | Freq: Four times a day (QID) | ORAL | Status: DC | PRN

## 2021-01-04 MED ORDER — TRAMADOL HCL 50 MG PO TABS
50.0000 mg | ORAL_TABLET | Freq: Four times a day (QID) | ORAL | Status: DC | PRN
  Administered 2021-01-05: 50 mg via ORAL
  Filled 2021-01-04: qty 1

## 2021-01-04 MED ORDER — METHOCARBAMOL 500 MG PO TABS: 500.0000 mg | ORAL_TABLET | Freq: Four times a day (QID) | ORAL | Status: DC | PRN

## 2021-01-04 MED ORDER — HYDROMORPHONE HCL 1 MG/ML IJ SOLN: 0.5000 mg | INTRAMUSCULAR | Status: DC | PRN

## 2021-01-04 MED ORDER — DEXTROSE IN LACTATED RINGERS 5 % IV SOLN
INTRAVENOUS | Status: DC
  Filled 2021-01-04: qty 1000

## 2021-01-04 MED ORDER — ENOXAPARIN SODIUM 40 MG/0.4ML IJ SOSY
40.0000 mg | PREFILLED_SYRINGE | INTRAMUSCULAR | Status: DC
  Filled 2021-01-04: qty 0.4

## 2021-01-04 MED ORDER — PROCHLORPERAZINE MALEATE 10 MG PO TABS
10.0000 mg | ORAL_TABLET | Freq: Four times a day (QID) | ORAL | Status: DC | PRN
  Filled 2021-01-04: qty 1

## 2021-01-04 MED ORDER — ONDANSETRON HCL 4 MG/2ML IJ SOLN: 4.0000 mg | Freq: Four times a day (QID) | INTRAMUSCULAR | Status: DC | PRN

## 2021-01-04 MED ORDER — LORATADINE 10 MG PO TABS
10.0000 mg | ORAL_TABLET | Freq: Every day | ORAL | Status: DC
  Administered 2021-01-05 – 2021-01-06 (×2): 10 mg via ORAL
  Filled 2021-01-04 (×2): qty 1

## 2021-01-04 MED ORDER — DIPHENHYDRAMINE HCL 50 MG/ML IJ SOLN: 12.5000 mg | Freq: Four times a day (QID) | INTRAMUSCULAR | Status: DC | PRN

## 2021-01-04 MED ORDER — KETOROLAC TROMETHAMINE 30 MG/ML IJ SOLN
30.0000 mg | Freq: Four times a day (QID) | INTRAMUSCULAR | Status: AC
  Administered 2021-01-05 (×2): 30 mg via INTRAVENOUS
  Filled 2021-01-04 (×3): qty 1

## 2021-01-04 MED ORDER — KETOROLAC TROMETHAMINE 30 MG/ML IJ SOLN
30.0000 mg | Freq: Four times a day (QID) | INTRAMUSCULAR | Status: DC | PRN
  Administered 2021-01-06: 30 mg via INTRAVENOUS
  Filled 2021-01-04: qty 1

## 2021-01-04 MED ORDER — ONDANSETRON 4 MG PO TBDP: 4.0000 mg | ORAL_TABLET | Freq: Four times a day (QID) | ORAL | Status: DC | PRN

## 2021-01-04 MED ORDER — IOHEXOL 350 MG/ML SOLN
80.0000 mL | Freq: Once | INTRAVENOUS | Status: AC | PRN
  Administered 2021-01-04: 80 mL via INTRAVENOUS

## 2021-01-04 MED ORDER — PROCHLORPERAZINE EDISYLATE 10 MG/2ML IJ SOLN: 5.0000 mg | Freq: Four times a day (QID) | INTRAMUSCULAR | Status: DC | PRN

## 2021-01-04 MED ORDER — MORPHINE SULFATE (PF) 4 MG/ML IV SOLN
4.0000 mg | Freq: Once | INTRAVENOUS | Status: AC
  Administered 2021-01-04: 4 mg via INTRAVENOUS
  Filled 2021-01-04: qty 1

## 2021-01-04 MED ORDER — SENNA 8.6 MG PO TABS
1.0000 | ORAL_TABLET | Freq: Two times a day (BID) | ORAL | Status: DC
  Administered 2021-01-05 – 2021-01-06 (×3): 8.6 mg via ORAL
  Filled 2021-01-04 (×3): qty 1

## 2021-01-04 NOTE — ED Provider Notes (Signed)
Emergency Medicine Provider Triage Evaluation Note  Stephanie Schroeder , a 40 y.o. female  was evaluated in triage.  Pt complains of abdominal pain since this morning.  She reports history of gallstones and was evaluated by surgery last Wednesday.  She is currently waiting time table for surgery date.  She reports since her visit with the surgeon she was fine until today.  Review of Systems  Positive: Abdominal pain, nausea Negative: Fever, dysuria, shortness of breath, diarrhea  Physical Exam  BP 117/70 (BP Location: Left Arm)   Pulse (!) 57   Temp 98.1 F (36.7 C) (Oral)   Resp 18   Ht 5\' 4"  (1.626 m)   Wt 61.2 kg   LMP 12/21/2020 (Approximate)   SpO2 98%   BMI 23.17 kg/m  Gen:   Awake, no distress   Resp:  Normal effort  MSK:   Moves extremities without difficulty  Other:  Diffuse abdominal tenderness  Medical Decision Making  Medically screening exam initiated at 12:58 PM.  Appropriate orders placed.  Lamoyne Palencia was informed that the remainder of the evaluation will be completed by another provider, this initial triage assessment does not replace that evaluation, and the importance of remaining in the ED until their evaluation is complete.     Wynetta Emery, PA-C 01/04/21 1300    14/12/22, MD 01/04/21 2142

## 2021-01-04 NOTE — ED Provider Notes (Signed)
[facility]-EMERGENCY DEPT Provider Note   CSN: [id number] Arrival date & time: 01/04/21  1207     History Chief Complaint  Patient presents with   Abdominal Pain    Stephanie Schroeder is a 40 y.o. female.  Patient presents ER chief complaint of abdominal pain.  She is had this abdominal pain intermittently for the past month.  She has been to the ER several times.  She is ultrasound done showing gallstones.  In an outpatient basis she saw surgery and was scheduled to have surgery next month.  She presents with recurrent pain that occurred this morning and has persisted throughout the day described as sharp and aching currently an 8 out of 10 in the right upper quadrant.  No reports of fevers or cough or vomiting or diarrhea.      Past Medical History:  Diagnosis Date   Bipolar 1 disorder (HCC)    Depression    Gallstones    ROM (rupture of membranes), premature 07/17/2013   SVD (spontaneous vaginal delivery) 07/17/2013    Patient Active Problem List   Diagnosis Date Noted   Lymphadenopathy 07/23/2018   Healthcare maintenance 07/23/2018   Left axillary pain 07/10/2018   Breast pain, left 07/10/2018   Screening breast examination 06/25/2018    Past Surgical History:  Procedure Laterality Date   UPPER GI ENDOSCOPY       OB History     Gravida  1   Para  1   Term  1   Preterm      AB      Living  1      SAB      IAB      Ectopic      Multiple      Live Births  1           Family History  Problem Relation Age of Onset   Hypertension Mother    Alcohol abuse Neg Hx    Arthritis Neg Hx    Asthma Neg Hx    Birth defects Neg Hx    Cancer Neg Hx    COPD Neg Hx    Depression Neg Hx    Diabetes Neg Hx    Drug abuse Neg Hx    Early death Neg Hx    Hearing loss Neg Hx    Heart disease Neg Hx    Hyperlipidemia Neg Hx    Kidney disease Neg Hx    Learning disabilities Neg Hx    Mental illness Neg Hx    Mental retardation  Neg Hx    Miscarriages / Stillbirths Neg Hx    Stroke Neg Hx    Vision loss Neg Hx    Varicose Veins Neg Hx     Social History   Tobacco Use   Smoking status: Former    Packs/day: 0.50    Types: Cigarettes    Quit date: 07/17/2017    Years since quitting: 3.4   Smokeless tobacco: Former  Building services engineer Use: Never used  Substance Use Topics   Alcohol use: No   Drug use: No    Comment: History of cannibus use    Home Medications Prior to Admission medications   Medication Sig Start Date End Date Taking? Authorizing Provider  cetirizine (ZYRTEC) 10 MG tablet Take 10 mg by mouth daily.    [provider]  escitalopram (LEXAPRO) 20 MG tablet Take 20 tablets by mouth daily.    [provider]  Multiple Vitamins-Minerals (MULTIVITAMIN GUMMIES WOMENS PO) Take 2 tablets by mouth daily.    [provider]  ondansetron (ZOFRAN-ODT) 4 MG disintegrating tablet Take 1 tablet (4 mg total) by mouth every 8 (eight) hours as needed for nausea or vomiting. 12/22/20   Roxy Horseman, PA-C  oxyCODONE-acetaminophen (PERCOCET) 5-325 MG tablet Take 1-2 tablets by mouth every 6 (six) hours as needed. 12/22/20   Roxy Horseman, PA-C    Allergies    Risperidone and related and Hydrocodone  Review of Systems   Review of Systems  Constitutional:  Negative for fever.  HENT:  Negative for ear pain.   Eyes:  Negative for pain.  Respiratory:  Negative for cough.   Cardiovascular:  Negative for chest pain.  Gastrointestinal:  Positive for abdominal pain.  Genitourinary:  Negative for flank pain.  Musculoskeletal:  Negative for back pain.  Skin:  Negative for rash.  Neurological:  Negative for headaches.   Physical Exam Updated Vital Signs BP 106/68   Pulse (!) 50   Temp 99.1 F (37.3 C) (Oral)   Resp 10   Ht 5\' 4"  (1.626 m)   Wt 61.2 kg   LMP 12/21/2020 (Approximate)   SpO2 97%   BMI 23.17 kg/m   Physical Exam Constitutional:      General: She is not  in acute distress.    Appearance: Normal appearance.  HENT:     Head: Normocephalic.     Nose: Nose normal.  Eyes:     Extraocular Movements: Extraocular movements intact.  Cardiovascular:     Rate and Rhythm: Normal rate.  Pulmonary:     Effort: Pulmonary effort is normal.  Abdominal:     Tenderness: There is abdominal tenderness in the right upper quadrant.  Musculoskeletal:        General: Normal range of motion.     Cervical back: Normal range of motion.  Neurological:     General: No focal deficit present.     Mental Status: She is alert. Mental status is at baseline.    ED Results / Procedures / Treatments   Labs (all labs ordered are listed, but only abnormal results are displayed) Labs Reviewed  CBC WITH DIFFERENTIAL/PLATELET - Abnormal; Notable for the following components:      Result Value   WBC 12.1 (*)    Abs Immature Granulocytes 0.13 (*)    All other components within normal limits  COMPREHENSIVE METABOLIC PANEL - Abnormal; Notable for the following components:   AST 12 (*)    All other components within normal limits  LIPASE, BLOOD  I-STAT BETA HCG BLOOD, ED ([facility], [facility], [facility] ONLY)    EKG None  Radiology CT ABDOMEN PELVIS W CONTRAST  Result Date: 01/04/2021 CLINICAL DATA:  A 39 year old female presents for evaluation of acute abdominal pain. Pending cholecystectomy by report. EXAM: CT ABDOMEN AND PELVIS WITH CONTRAST TECHNIQUE: Multidetector CT imaging of the abdomen and pelvis was performed using the standard protocol following bolus administration of intravenous contrast. CONTRAST:  6mL OMNIPAQUE IOHEXOL 350 MG/ML SOLN COMPARISON:  December 21, 2020. FINDINGS: Lower chest: Lung bases are clear without effusion or consolidative process. Hepatobiliary: Low-attenuation lesion in the LEFT hepatic lobe measuring 11 mm (image 14/2) compatible with small hepatic hemangioma. No focal, suspicious hepatic lesion with some fatty intensification along the fissure for the  falciform ligament. The portal vein is patent. Hepatic veins are patent. The gallbladder is under distended without signs of adjacent stranding. Trace amount of pericholecystic fluid  may be evident. Potentially present previously. No substantial biliary duct distension. Pancreas: Unremarkable. No pancreatic ductal dilatation or surrounding inflammatory changes. Spleen: Spleen normal size and contour. Adrenals/Urinary Tract: Adrenal glands are unremarkable. Symmetric renal enhancement. No sign of hydronephrosis. No suspicious renal lesion or perinephric stranding. Urinary bladder is grossly unremarkable. Small cyst in the RIGHT kidney. Stomach/Bowel: No acute process related to the gastrointestinal tract. Normal appendix. Vascular/Lymphatic: Aorta with smooth contours. IVC with smooth contours. No aneurysmal dilation of the abdominal aorta. There is no gastrohepatic or hepatoduodenal ligament lymphadenopathy. No retroperitoneal or mesenteric lymphadenopathy. No pelvic sidewall lymphadenopathy. Reproductive: Unremarkable by CT. Other: No substantial ascites.  No signs of free air. Musculoskeletal: No acute bone finding. No destructive bone process. Spinal degenerative changes. IMPRESSION: The gallbladder is under distended without signs of adjacent stranding. Trace amount of pericholecystic fluid is unchanged. If there is high clinical concern for acute cholecystitis could consider HIDA scan for further evaluation though the gallbladder is not substantially distended on the current exam. Low-attenuation lesion in the LEFT hepatic lobe measuring 11 mm compatible with small hepatic hemangioma. Normal appendix. Electronically Signed   By: Donzetta Kohut M.D.   On: 01/04/2021 16:09    Procedures Procedures   Medications Ordered in ED Medications  ondansetron (ZOFRAN) injection 4 mg (4 mg Intravenous Given 01/04/21 1949)  iohexol (OMNIPAQUE) 350 MG/ML injection 80 mL (80 mLs Intravenous Contrast Given 01/04/21  1544)  morphine 4 MG/ML injection 4 mg (4 mg Intravenous Given 01/04/21 2046)    ED Course  I have reviewed the triage vital signs and the nursing notes.  Pertinent labs & imaging results that were available during my care of the patient were reviewed by me and considered in my medical decision making (see chart for details).    MDM Rules/Calculators/A&P                           Labs show white count 12 chemistry unremarkable.  CT on pelvis shows tender gallbladder with trace pericholecystic fluid.  Patient remains tender in the right upper quadrant.  Surgical consultation requested.  Surgery to see the patient for disposition.  Final Clinical Impression(s) / ED Diagnoses Final diagnoses:  Symptomatic cholelithiasis    Rx / DC Orders ED Discharge Orders     None        Cheryll Cockayne, MD 01/04/21 2143

## 2021-01-04 NOTE — H&P (Signed)
Stephanie Schroeder is an 40 y.o. female.   Chief Complaint: gallstones HPI:  Pt is a 40 yo F who has had 3-4 weeks of epigastric abdominal pain.  She went to the ED 11/28 with similar symptoms.  A Ct showed concern for enteritis, but symptoms were more consistent with gallstones.  A RUQ u/s was done in follow up with was positive for stones, but no evidence of acute cholecystitis.  She saw Dr. Daphine Deutscher in our office 12/7 and she was posted for surgery.  However, she developed severe pain again today and returned to the ED.  Pain persisted and was around 8/10.  LFTs are normal, but she has continued pain despite 4 mg morphine.  WBCs are mildly elevated.  She also has nausea.  She has had minimal tolerance of PO intake.  She was taking in smoothies, but for the last several days even that has made her feel ill.    She denies jaundice/fever/chills.     Past Medical History:  Diagnosis Date   Bipolar 1 disorder (HCC)    Depression    Gallstones    ROM (rupture of membranes), premature 07/17/2013   SVD (spontaneous vaginal delivery) 07/17/2013    Past Surgical History:  Procedure Laterality Date   UPPER GI ENDOSCOPY      Family History  Problem Relation Age of Onset   Hypertension Mother    Alcohol abuse Neg Hx    Arthritis Neg Hx    Asthma Neg Hx    Birth defects Neg Hx    Cancer Neg Hx    COPD Neg Hx    Depression Neg Hx    Diabetes Neg Hx    Drug abuse Neg Hx    Early death Neg Hx    Hearing loss Neg Hx    Heart disease Neg Hx    Hyperlipidemia Neg Hx    Kidney disease Neg Hx    Learning disabilities Neg Hx    Mental illness Neg Hx    Mental retardation Neg Hx    Miscarriages / Stillbirths Neg Hx    Stroke Neg Hx    Vision loss Neg Hx    Varicose Veins Neg Hx    Social History:  reports that she quit smoking about 3 years ago. Her smoking use included cigarettes. She smoked an average of .5 packs per day. She has quit using smokeless tobacco. She reports that she does not drink  alcohol and does not use drugs.  Allergies:  Allergies  Allergen Reactions   Risperidone And Related Swelling    Tongue swelling   Hydrocodone Nausea And Vomiting   Meds: Lexapro Zyrtec mvt    Results for orders placed or performed during the hospital encounter of 01/04/21 (from the past 48 hour(s))  CBC with Differential     Status: Abnormal   Collection Time: 01/04/21  1:28 PM  Result Value Ref Range   WBC 12.1 (H) 4.0 - 10.5 K/uL   RBC 4.74 3.87 - 5.11 MIL/uL   Hemoglobin 14.9 12.0 - 15.0 g/dL   HCT 50.5 69.7 - 94.8 %   MCV 94.7 80.0 - 100.0 fL   MCH 31.4 26.0 - 34.0 pg   MCHC 33.2 30.0 - 36.0 g/dL   RDW 01.6 55.3 - 74.8 %   Platelets 373 150 - 400 K/uL   nRBC 0.0 0.0 - 0.2 %   Neutrophils Relative % 63 %   Neutro Abs 7.6 1.7 - 7.7 K/uL   Lymphocytes Relative  28 %   Lymphs Abs 3.4 0.7 - 4.0 K/uL   Monocytes Relative 6 %   Monocytes Absolute 0.7 0.1 - 1.0 K/uL   Eosinophils Relative 1 %   Eosinophils Absolute 0.1 0.0 - 0.5 K/uL   Basophils Relative 1 %   Basophils Absolute 0.1 0.0 - 0.1 K/uL   Immature Granulocytes 1 %   Abs Immature Granulocytes 0.13 (H) 0.00 - 0.07 K/uL    Comment: Performed at [facility], [address] [place], [place] [address]  Comprehensive metabolic panel     Status: Abnormal   Collection Time: 01/04/21  1:28 PM  Result Value Ref Range   Sodium 139 135 - 145 mmol/L   Potassium 4.5 3.5 - 5.1 mmol/L   Chloride 107 98 - 111 mmol/L   CO2 26 22 - 32 mmol/L   Glucose, Bld 91 70 - 99 mg/dL    Comment: Glucose reference range applies only to samples taken after fasting for at least 8 hours.   BUN 10 6 - 20 mg/dL   Creatinine, Ser 7.93 0.44 - 1.00 mg/dL   Calcium 9.4 8.9 - 90.3 mg/dL   Total Protein 7.0 6.5 - 8.1 g/dL   Albumin 4.3 3.5 - 5.0 g/dL   AST 12 (L) 15 - 41 U/L   ALT 12 0 - 44 U/L   Alkaline Phosphatase 50 38 - 126 U/L   Total Bilirubin 0.3 0.3 - 1.2 mg/dL   GFR, Estimated >00 >92 mL/min    Comment:  (NOTE) Calculated using the CKD-EPI Creatinine Equation (2021)    Anion gap 6 5 - 15    Comment: Performed at [facility], [address] [place], [place] [address]  Lipase, blood     Status: None   Collection Time: 01/04/21  1:28 PM  Result Value Ref Range   Lipase 28 11 - 51 U/L    Comment: Performed at [facility], [address] [place], [place] [address]  I-Stat Beta hCG blood, ED ([facility], [facility], [facility] only)     Status: None   Collection Time: 01/04/21  1:31 PM  Result Value Ref Range   I-stat hCG, quantitative <5.0 <5 mIU/mL   Comment 3            Comment:   GEST. AGE      CONC.  (mIU/mL)   <=1 WEEK        5 - 50     2 WEEKS       50 - 500     3 WEEKS       100 - 10,000     4 WEEKS     1,000 - 30,000        FEMALE AND NON-PREGNANT FEMALE:     LESS THAN 5 mIU/mL    CT ABDOMEN PELVIS W CONTRAST  Result Date: 01/04/2021 CLINICAL DATA:  A 40 year old female presents for evaluation of acute abdominal pain. Pending cholecystectomy by report. EXAM: CT ABDOMEN AND PELVIS WITH CONTRAST TECHNIQUE: Multidetector CT imaging of the abdomen and pelvis was performed using the standard protocol following bolus administration of intravenous contrast. CONTRAST:  37mL OMNIPAQUE IOHEXOL 350 MG/ML SOLN COMPARISON:  December 21, 2020. FINDINGS: Lower chest: Lung bases are clear without effusion or consolidative process. Hepatobiliary: Low-attenuation lesion in the LEFT hepatic lobe measuring 11 mm (image 14/2) compatible with small hepatic hemangioma. No focal, suspicious hepatic lesion with some fatty intensification along the fissure for the falciform ligament.  The portal vein is patent. Hepatic veins are patent. The gallbladder is under distended without signs of adjacent stranding. Trace amount of pericholecystic fluid may be evident. Potentially present previously. No substantial biliary duct distension. Pancreas: Unremarkable. No pancreatic ductal dilatation or  surrounding inflammatory changes. Spleen: Spleen normal size and contour. Adrenals/Urinary Tract: Adrenal glands are unremarkable. Symmetric renal enhancement. No sign of hydronephrosis. No suspicious renal lesion or perinephric stranding. Urinary bladder is grossly unremarkable. Small cyst in the RIGHT kidney. Stomach/Bowel: No acute process related to the gastrointestinal tract. Normal appendix. Vascular/Lymphatic: Aorta with smooth contours. IVC with smooth contours. No aneurysmal dilation of the abdominal aorta. There is no gastrohepatic or hepatoduodenal ligament lymphadenopathy. No retroperitoneal or mesenteric lymphadenopathy. No pelvic sidewall lymphadenopathy. Reproductive: Unremarkable by CT. Other: No substantial ascites.  No signs of free air. Musculoskeletal: No acute bone finding. No destructive bone process. Spinal degenerative changes. IMPRESSION: The gallbladder is under distended without signs of adjacent stranding. Trace amount of pericholecystic fluid is unchanged. If there is high clinical concern for acute cholecystitis could consider HIDA scan for further evaluation though the gallbladder is not substantially distended on the current exam. Low-attenuation lesion in the LEFT hepatic lobe measuring 11 mm compatible with small hepatic hemangioma. Normal appendix. Electronically Signed   By: Donzetta Kohut M.D.   On: 01/04/2021 16:09    Review of Systems  Constitutional: Negative.   HENT: Negative.    Eyes: Negative.   Respiratory: Negative.    Cardiovascular: Negative.   Gastrointestinal:  Positive for abdominal pain and nausea.  Endocrine: Negative.   Genitourinary: Negative.   Musculoskeletal: Negative.   Skin: Negative.   Allergic/Immunologic: Negative.   Neurological: Negative.   Hematological: Negative.   Psychiatric/Behavioral: Negative.    All other systems reviewed and are negative.   Blood pressure 102/67, pulse (!) 48, temperature 99.1 F (37.3 C), temperature  source Oral, resp. rate 14, height 5\' 4"  (1.626 m), weight 61.2 kg, last menstrual period 12/21/2020, SpO2 97 %, unknown if currently breastfeeding. Physical Exam Vitals reviewed.  Constitutional:      General: She is in acute distress (looks uncomfortable).     Appearance: She is well-developed.     Comments: Very thin  HENT:     Head: Normocephalic and atraumatic.     Mouth/Throat:     Mouth: Mucous membranes are moist.  Eyes:     General: No scleral icterus.    Extraocular Movements: Extraocular movements intact.     Pupils: Pupils are equal, round, and reactive to light.  Cardiovascular:     Rate and Rhythm: Regular rhythm. Bradycardia present.  Pulmonary:     Effort: Pulmonary effort is normal. No respiratory distress.  Chest:     Chest wall: No tenderness.  Abdominal:     General: Abdomen is flat. There is no distension.     Palpations: Abdomen is soft. There is no shifting dullness, fluid wave, hepatomegaly, splenomegaly or mass.     Tenderness: There is abdominal tenderness in the right upper quadrant and epigastric area. There is no guarding or rebound. Positive signs include Murphy's sign. Negative signs include McBurney's sign.     Hernia: No hernia is present.  Skin:    General: Skin is warm and dry.     Capillary Refill: Capillary refill takes 2 to 3 seconds.     Coloration: Skin is not cyanotic, jaundiced, mottled or pale.     Findings: No erythema or rash.  Neurological:     General:  No focal deficit present.     Mental Status: She is alert and oriented to person, place, and time.     Cranial Nerves: No cranial nerve deficit.     Motor: No weakness.  Psychiatric:        Mood and Affect: Mood normal. Mood is not anxious or depressed.        Behavior: Behavior normal.      Assessment/Plan Early acute cholecystitis with stones NPO after midnight. Likely lap chole tomorrow if schedule allows.    Pain and nausea control IV fluids IV antibiotics. Likely can  go home post op tomorrow.    Almond Lint, MD 01/04/2021, 10:25 PM

## 2021-01-04 NOTE — ED Triage Notes (Signed)
Patient c/o upper abdominal pain and states she is suppose to have her gallbladder removed, but they have not scheduled a specific date. Patient states she has been prescribed oxycodone, but can not take because it makes her so sleepy.

## 2021-01-05 ENCOUNTER — Observation Stay (HOSPITAL_COMMUNITY): Payer: No Typology Code available for payment source | Admitting: Registered Nurse

## 2021-01-05 ENCOUNTER — Encounter (HOSPITAL_COMMUNITY)
Admission: EM | Disposition: A | Discharge status: Home / Self Care | Payer: Self-pay | Source: Home / Self Care | Attending: Emergency Medicine

## 2021-01-05 ENCOUNTER — Observation Stay (HOSPITAL_COMMUNITY): Payer: No Typology Code available for payment source

## 2021-01-05 HISTORY — PX: CHOLECYSTECTOMY: SHX55

## 2021-01-05 LAB — HIV ANTIBODY (ROUTINE TESTING W REFLEX): HIV Screen 4th Generation wRfx: NONREACTIVE

## 2021-01-05 LAB — RESP PANEL BY RT-PCR (FLU A&B, COVID) ARPGX2
Influenza A by PCR: NEGATIVE
Influenza B by PCR: NEGATIVE
SARS Coronavirus 2 by RT PCR: NEGATIVE

## 2021-01-05 LAB — SURGICAL PCR SCREEN
MRSA, PCR: NEGATIVE
Staphylococcus aureus: NEGATIVE

## 2021-01-05 SURGERY — LAPAROSCOPIC CHOLECYSTECTOMY WITH INTRAOPERATIVE CHOLANGIOGRAM
Anesthesia: General | Site: Abdomen

## 2021-01-05 MED ORDER — DEXAMETHASONE SODIUM PHOSPHATE 10 MG/ML IJ SOLN
INTRAMUSCULAR | Status: AC
  Filled 2021-01-05: qty 1

## 2021-01-05 MED ORDER — OXYCODONE-ACETAMINOPHEN 5-325 MG PO TABS
1.0000 | ORAL_TABLET | ORAL | Status: DC | PRN
  Administered 2021-01-06: 10:00:00 1 via ORAL
  Filled 2021-01-05: qty 1

## 2021-01-05 MED ORDER — LACTATED RINGERS IV SOLN: INTRAVENOUS | Status: DC

## 2021-01-05 MED ORDER — LACTATED RINGERS IR SOLN
Status: DC | PRN
  Administered 2021-01-05: 1000 mL

## 2021-01-05 MED ORDER — ACETAMINOPHEN 10 MG/ML IV SOLN
1000.0000 mg | Freq: Once | INTRAVENOUS | Status: DC | PRN
  Administered 2021-01-05: 1000 mg via INTRAVENOUS

## 2021-01-05 MED ORDER — FENTANYL CITRATE PF 50 MCG/ML IJ SOSY
PREFILLED_SYRINGE | INTRAMUSCULAR | Status: AC
  Filled 2021-01-05: qty 1

## 2021-01-05 MED ORDER — ACETAMINOPHEN 10 MG/ML IV SOLN
INTRAVENOUS | Status: AC
  Filled 2021-01-05: qty 100

## 2021-01-05 MED ORDER — ONDANSETRON HCL 4 MG/2ML IJ SOLN
INTRAMUSCULAR | Status: AC
  Filled 2021-01-05: qty 2

## 2021-01-05 MED ORDER — LIDOCAINE 2% (20 MG/ML) 5 ML SYRINGE
INTRAMUSCULAR | Status: DC | PRN
  Administered 2021-01-05: 60 mg via INTRAVENOUS

## 2021-01-05 MED ORDER — MIDAZOLAM HCL 5 MG/5ML IJ SOLN
INTRAMUSCULAR | Status: DC | PRN
  Administered 2021-01-05: 2 mg via INTRAVENOUS

## 2021-01-05 MED ORDER — DEXAMETHASONE SODIUM PHOSPHATE 10 MG/ML IJ SOLN
INTRAMUSCULAR | Status: DC | PRN
  Administered 2021-01-05: 5 mg via INTRAVENOUS

## 2021-01-05 MED ORDER — CHLORHEXIDINE GLUCONATE 0.12 % MT SOLN: 15.0000 mL | Freq: Once | OROMUCOSAL | Status: DC

## 2021-01-05 MED ORDER — ROCURONIUM BROMIDE 10 MG/ML (PF) SYRINGE
PREFILLED_SYRINGE | INTRAVENOUS | Status: DC | PRN
  Administered 2021-01-05: 50 mg via INTRAVENOUS

## 2021-01-05 MED ORDER — FENTANYL CITRATE (PF) 100 MCG/2ML IJ SOLN
INTRAMUSCULAR | Status: AC
  Filled 2021-01-05: qty 2

## 2021-01-05 MED ORDER — BUPIVACAINE-EPINEPHRINE 0.25% -1:200000 IJ SOLN
INTRAMUSCULAR | Status: DC | PRN
  Administered 2021-01-05: 15 mL

## 2021-01-05 MED ORDER — ORAL CARE MOUTH RINSE: 15.0000 mL | Freq: Once | OROMUCOSAL | Status: DC

## 2021-01-05 MED ORDER — CHLORHEXIDINE GLUCONATE CLOTH 2 % EX PADS
6.0000 | MEDICATED_PAD | Freq: Once | CUTANEOUS | Status: AC
  Administered 2021-01-05: 6 via TOPICAL

## 2021-01-05 MED ORDER — PROPOFOL 10 MG/ML IV BOLUS
INTRAVENOUS | Status: AC
  Filled 2021-01-05: qty 20

## 2021-01-05 MED ORDER — DEXAMETHASONE SODIUM PHOSPHATE 10 MG/ML IJ SOLN
INTRAMUSCULAR | Status: DC | PRN
  Administered 2021-01-05: 8 mg via INTRAVENOUS

## 2021-01-05 MED ORDER — IOPAMIDOL (ISOVUE-300) INJECTION 61%
INTRAVENOUS | Status: DC | PRN
  Administered 2021-01-05: 10 mL

## 2021-01-05 MED ORDER — EPHEDRINE SULFATE-NACL 50-0.9 MG/10ML-% IV SOSY
PREFILLED_SYRINGE | INTRAVENOUS | Status: DC | PRN
  Administered 2021-01-05: 5 mg via INTRAVENOUS

## 2021-01-05 MED ORDER — PROMETHAZINE HCL 25 MG/ML IJ SOLN: 6.2500 mg | INTRAMUSCULAR | Status: DC | PRN

## 2021-01-05 MED ORDER — FENTANYL CITRATE PF 50 MCG/ML IJ SOSY
25.0000 ug | PREFILLED_SYRINGE | INTRAMUSCULAR | Status: DC | PRN
  Administered 2021-01-05 (×2): 50 ug via INTRAVENOUS

## 2021-01-05 MED ORDER — PROPOFOL 10 MG/ML IV BOLUS
INTRAVENOUS | Status: DC | PRN
  Administered 2021-01-05: 150 mg via INTRAVENOUS

## 2021-01-05 MED ORDER — SODIUM CHLORIDE 0.9 % IV BOLUS
500.0000 mL | Freq: Once | INTRAVENOUS | Status: AC
  Administered 2021-01-05: 500 mL via INTRAVENOUS

## 2021-01-05 MED ORDER — LIDOCAINE HCL (PF) 2 % IJ SOLN
INTRAMUSCULAR | Status: AC
  Filled 2021-01-05: qty 5

## 2021-01-05 MED ORDER — ONDANSETRON HCL 4 MG/2ML IJ SOLN
INTRAMUSCULAR | Status: DC | PRN
  Administered 2021-01-05: 4 mg via INTRAVENOUS

## 2021-01-05 MED ORDER — MIDAZOLAM HCL 2 MG/2ML IJ SOLN
INTRAMUSCULAR | Status: AC
  Filled 2021-01-05: qty 2

## 2021-01-05 MED ORDER — ROCURONIUM BROMIDE 10 MG/ML (PF) SYRINGE
PREFILLED_SYRINGE | INTRAVENOUS | Status: AC
  Filled 2021-01-05: qty 10

## 2021-01-05 MED ORDER — BUPIVACAINE-EPINEPHRINE (PF) 0.25% -1:200000 IJ SOLN
INTRAMUSCULAR | Status: AC
  Filled 2021-01-05: qty 30

## 2021-01-05 MED ORDER — SODIUM CHLORIDE 0.9 % IV BOLUS: 500.0000 mL | Freq: Once | INTRAVENOUS | Status: DC

## 2021-01-05 MED ORDER — SUGAMMADEX SODIUM 200 MG/2ML IV SOLN
INTRAVENOUS | Status: DC | PRN
  Administered 2021-01-05: 150 mg via INTRAVENOUS

## 2021-01-05 MED ORDER — FENTANYL CITRATE (PF) 100 MCG/2ML IJ SOLN
INTRAMUSCULAR | Status: DC | PRN
  Administered 2021-01-05: 25 ug via INTRAVENOUS
  Administered 2021-01-05: 100 ug via INTRAVENOUS

## 2021-01-05 SURGICAL SUPPLY — 38 items
APPLIER CLIP ROT 10 11.4 M/L (STAPLE) ×3
BAG COUNTER SPONGE SURGICOUNT (BAG) IMPLANT
BAG SURGICOUNT SPONGE COUNTING (BAG)
CHLORAPREP W/TINT 26 (MISCELLANEOUS) ×3 IMPLANT
CLIP APPLIE ROT 10 11.4 M/L (STAPLE) ×1 IMPLANT
COVER MAYO STAND STRL (DRAPES) ×3 IMPLANT
DECANTER SPIKE VIAL GLASS SM (MISCELLANEOUS) ×3 IMPLANT
DERMABOND ADVANCED (GAUZE/BANDAGES/DRESSINGS) ×2
DERMABOND ADVANCED .7 DNX12 (GAUZE/BANDAGES/DRESSINGS) IMPLANT
DRAPE C-ARM 42X120 X-RAY (DRAPES) ×3 IMPLANT
DRAPE UTILITY XL STRL (DRAPES) ×3 IMPLANT
DRAPE WARM FLUID 44X44 (DRAPES) ×3 IMPLANT
ELECT REM PT RETURN 15FT ADLT (MISCELLANEOUS) ×3 IMPLANT
ENDOLOOP SUT PDS II  0 18 (SUTURE) ×3
ENDOLOOP SUT PDS II 0 18 (SUTURE) IMPLANT
GLOVE SURG MICRO LTX SZ7.5 (GLOVE) ×3 IMPLANT
GLOVE SURG UNDER LTX SZ8 (GLOVE) ×3 IMPLANT
GOWN STRL REUS W/TWL XL LVL3 (GOWN DISPOSABLE) ×6 IMPLANT
HEMOSTAT SURGICEL 4X8 (HEMOSTASIS) IMPLANT
IRRIG SUCT STRYKERFLOW 2 WTIP (MISCELLANEOUS) ×3
IRRIGATION SUCT STRKRFLW 2 WTP (MISCELLANEOUS) ×1 IMPLANT
KIT BASIN OR (CUSTOM PROCEDURE TRAY) ×3 IMPLANT
KIT TURNOVER KIT A (KITS) IMPLANT
PENCIL SMOKE EVACUATOR (MISCELLANEOUS) IMPLANT
POUCH SPECIMEN RETRIEVAL 10MM (ENDOMECHANICALS) ×3 IMPLANT
PROTECTOR NERVE ULNAR (MISCELLANEOUS) IMPLANT
SCISSORS LAP 5X35 DISP (ENDOMECHANICALS) IMPLANT
SET CHOLANGIOGRAPH MIX (MISCELLANEOUS) ×3 IMPLANT
SET TUBE SMOKE EVAC HIGH FLOW (TUBING) IMPLANT
SLEEVE XCEL OPT CAN 5 100 (ENDOMECHANICALS) ×3 IMPLANT
SUT MNCRL AB 4-0 PS2 18 (SUTURE) ×3 IMPLANT
TAPE CLOTH 4X10 WHT NS (GAUZE/BANDAGES/DRESSINGS) IMPLANT
TOWEL OR 17X26 10 PK STRL BLUE (TOWEL DISPOSABLE) ×3 IMPLANT
TOWEL OR NON WOVEN STRL DISP B (DISPOSABLE) ×3 IMPLANT
TRAY LAPAROSCOPIC (CUSTOM PROCEDURE TRAY) ×3 IMPLANT
TROCAR BLADELESS OPT 5 100 (ENDOMECHANICALS) ×3 IMPLANT
TROCAR XCEL BLUNT TIP 100MML (ENDOMECHANICALS) ×3 IMPLANT
TROCAR XCEL NON-BLD 11X100MML (ENDOMECHANICALS) ×3 IMPLANT

## 2021-01-05 NOTE — Plan of Care (Signed)
  Problem: Clinical Measurements: Goal: Respiratory complications will improve Outcome: Progressing   Problem: Coping: Goal: Level of anxiety will decrease Outcome: Progressing   Problem: Pain Managment: Goal: General experience of comfort will improve Outcome: Progressing   Problem: Activity: Goal: Risk for activity intolerance will decrease Outcome: Progressing

## 2021-01-05 NOTE — Discharge Instructions (Signed)
[facility] LAPAROSCOPIC SURGERY: POST OP INSTRUCTIONS Always review your discharge instruction sheet given to you by the facility where your surgery was performed. IF YOU HAVE DISABILITY OR FAMILY LEAVE FORMS, YOU MUST BRING THEM TO THE OFFICE FOR PROCESSING.   DO NOT GIVE THEM TO YOUR DOCTOR.  PAIN CONTROL  First take acetaminophen (Tylenol) AND/or ibuprofen (Advil) to control your pain after surgery.  Follow directions on package.  Taking acetaminophen (Tylenol) and/or ibuprofen (Advil) regularly after surgery will help to control your pain and lower the amount of prescription pain medication you may need.  You should not take more than 3,000 mg (3 grams) of acetaminophen (Tylenol) in 24 hours.  You should not take ibuprofen (Advil), aleve, motrin, naprosyn or other NSAIDS if you have a history of stomach ulcers or chronic kidney disease.  A prescription for pain medication may be given to you upon discharge.  Take your pain medication as prescribed, if you still have uncontrolled pain after taking acetaminophen (Tylenol) or ibuprofen (Advil). Use ice packs to help control pain. If you need a refill on your pain medication, please contact your pharmacy.  They will contact our office to request authorization. Prescriptions will not be filled after 5pm or on week-ends.  HOME MEDICATIONS Take your usually prescribed medications unless otherwise directed.  DIET You should follow a light diet the first few days after arrival home.  Be sure to include lots of fluids daily. Avoid fatty, fried foods.   CONSTIPATION It is common to experience some constipation after surgery and if you are taking pain medication.  Increasing fluid intake and taking a stool softener (such as Colace) will usually help or prevent this problem from occurring.  A mild laxative (Milk of Magnesia or Miralax) should be taken according to package instructions if there are no bowel movements after 48  hours.  WOUND/INCISION CARE Most patients will experience some swelling and bruising in the area of the incisions.  Ice packs will help.  Swelling and bruising can take several days to resolve.  Unless discharge instructions indicate otherwise, follow guidelines below  STERI-STRIPS - you may remove your outer bandages 48 hours after surgery, and you may shower at that time.  You have steri-strips (small skin tapes) in place directly over the incision.  These strips should be left on the skin for 7-10 days.   DERMABOND/SKIN GLUE - you may shower in 24 hours.  The glue will flake off over the next 2-3 weeks. Any sutures or staples will be removed at the office during your follow-up visit.  ACTIVITIES You may resume regular (light) daily activities beginning the next day--such as daily self-care, walking, climbing stairs--gradually increasing activities as tolerated.  You may have sexual intercourse when it is comfortable.  Refrain from any heavy lifting or straining until approved by your doctor. You may drive when you are no longer taking prescription pain medication, you can comfortably wear a seatbelt, and you can safely maneuver your car and apply brakes.  FOLLOW-UP You should see your doctor in the office for a follow-up appointment approximately 2-3 weeks after your surgery.  You should have been given your post-op/follow-up appointment when your surgery was scheduled.  If you did not receive a post-op/follow-up appointment, make sure that you call for this appointment within a day or two after you arrive home to insure a convenient appointment time.   WHEN TO CALL YOUR DOCTOR: Fever over 101.0 Inability to urinate Continued bleeding from incision.   Increased pain, redness, or drainage from the incision. Increasing abdominal pain  The clinic staff is available to answer your questions during regular business hours.  Please don't hesitate to call and ask to speak to one of the nurses for  clinical concerns.  If you have a medical emergency, go to the nearest emergency room or call 911.  A surgeon from [facility] is always on call at the hospital. [address], [address], [place], [place]  [address] ? [address] [address], [place]   [address] [phone] ? [phone] ? FAX [phone] Web site: [email or web address]  

## 2021-01-05 NOTE — Transfer of Care (Signed)
Immediate Anesthesia Transfer of Care Note  Patient: Stephanie Schroeder  Procedure(s) Performed: LAPAROSCOPIC CHOLECYSTECTOMY WITH INTRAOPERATIVE CHOLANGIOGRAM (Abdomen)  Patient Location: PACU  Anesthesia Type:General  Level of Consciousness: awake, alert , oriented and patient cooperative  Airway & Oxygen Therapy: Patient Spontanous Breathing and Patient connected to face mask oxygen  Post-op Assessment: Report given to RN, Post -op Vital signs reviewed and stable and Patient moving all extremities  Post vital signs: Reviewed and stable  Last Vitals:  Vitals Value Taken Time  BP 125/60 01/05/21 1305  Temp    Pulse 100 01/05/21 1308  Resp 14 01/05/21 1308  SpO2 100 % 01/05/21 1308  Vitals shown include unvalidated device data.  Last Pain:  Vitals:   01/05/21 0623  TempSrc: Oral  PainSc:          Complications: No notable events documented.

## 2021-01-05 NOTE — Progress Notes (Signed)
   01/05/21 0509  Assess: MEWS Score  BP (!) 91/57  Pulse Rate (!) 50  Assess: MEWS Score  MEWS Temp 0  MEWS Systolic 1  MEWS Pulse 1  MEWS RR 0  MEWS LOC 0  MEWS Score 2  MEWS Score Color Yellow  Treat  Pain Scale 0-10  Pain Score Asleep  Notify: Charge Nurse/RN  Name of Charge Nurse/RN Notified Bobby, RN  Date Charge Nurse/RN Notified 01/05/21  Time Charge Nurse/RN Notified 0600  Notify: Provider  Provider Name/Title Dr Donell Beers  Date Provider Notified 01/05/21  Time Provider Notified [phone]  Notification Type Page  Notification Reason Other (Comment) (Mews Yellow)  Provider response See new orders  Date of Provider Response 01/05/21  Time of Provider Response [phone]  Document  Patient Outcome Stabilized after interventions  Progress note created (see row info) Yes  Assess: SIRS CRITERIA  SIRS Temperature  0  SIRS Pulse 0  SIRS Respirations  0  SIRS WBC 0  SIRS Score Sum  0    Dr. Donell Beers made aware pt mews changed to yellow, ordered for one time dose of 0.9NS bolus. Pt has no c/o chest pain nor any distress; pt is voiding; will continue to monitor pt.

## 2021-01-05 NOTE — Anesthesia Preprocedure Evaluation (Addendum)
Anesthesia Evaluation  Patient identified by MRN, date of birth, ID band Patient awake    Reviewed: Allergy & Precautions, NPO status , Patient's Chart, lab work & pertinent test results  Airway Mallampati: II  TM Distance: >3 FB Neck ROM: Full    Dental no notable dental hx.    Pulmonary neg pulmonary ROS, Patient abstained from smoking., former smoker,    Pulmonary exam normal        Cardiovascular negative cardio ROS   Rhythm:Regular Rate:Normal     Neuro/Psych Depression Bipolar Disorder negative neurological ROS     GI/Hepatic Neg liver ROS, Acute cholecystitis    Endo/Other  negative endocrine ROS  Renal/GU negative Renal ROS  negative genitourinary   Musculoskeletal negative musculoskeletal ROS (+)   Abdominal Normal abdominal exam  (+)   Peds  Hematology negative hematology ROS (+)   Anesthesia Other Findings   Reproductive/Obstetrics                            Anesthesia Physical Anesthesia Plan  ASA: 2  Anesthesia Plan: General   Post-op Pain Management:    Induction: Intravenous  PONV Risk Score and Plan: 3 and Ondansetron, Dexamethasone, Midazolam and Treatment may vary due to age or medical condition  Airway Management Planned: Mask and Oral ETT  Additional Equipment: None  Intra-op Plan:   Post-operative Plan: Extubation in OR  Informed Consent: I have reviewed the patients History and Physical, chart, labs and discussed the procedure including the risks, benefits and alternatives for the proposed anesthesia with the patient or authorized representative who has indicated his/her understanding and acceptance.     Dental advisory given  Plan Discussed with: CRNA  Anesthesia Plan Comments: (Lab Results      Component                Value               Date                      WBC                      11.2 (H)            01/04/2021                HGB                       14.1                01/04/2021                HCT                      43.1                01/04/2021                MCV                      95.6                01/04/2021                PLT                      363  01/04/2021           Lab Results      Component                Value               Date                      NA                       139                 01/04/2021                K                        4.5                 01/04/2021                CO2                      26                  01/04/2021                GLUCOSE                  91                  01/04/2021                BUN                      10                  01/04/2021                CREATININE               0.65                01/04/2021                CALCIUM                  9.4                 01/04/2021                GFRNONAA                 >60                 01/04/2021           Lab Results      Component                Value               Date                      HCG                      <5.0                01/04/2021          )  Anesthesia Quick Evaluation

## 2021-01-05 NOTE — Anesthesia Procedure Notes (Signed)
Procedure Name: Intubation Date/Time: 01/05/2021 11:30 AM Performed by: Victoriano Lain, CRNA Pre-anesthesia Checklist: Patient identified, Emergency Drugs available, Suction available, Patient being monitored and Timeout performed Patient Re-evaluated:Patient Re-evaluated prior to induction Oxygen Delivery Method: Circle system utilized Preoxygenation: Pre-oxygenation with 100% oxygen Induction Type: IV induction Ventilation: Mask ventilation without difficulty Laryngoscope Size: Mac and 4 Grade View: Grade I Tube type: Oral Tube size: 7.5 mm Number of attempts: 1 Airway Equipment and Method: Stylet Placement Confirmation: ETT inserted through vocal cords under direct vision, positive ETCO2 and breath sounds checked- equal and bilateral Secured at: 21 cm Tube secured with: Tape Dental Injury: Teeth and Oropharynx as per pre-operative assessment

## 2021-01-05 NOTE — Progress Notes (Signed)
Transition of Care [facility]) Screening Note  Patient Details  Name: Stephanie Schroeder Date of Birth: Nov 22, 1980  Transition of Care [facility]) CM/SW Contact:    Ewing Schlein, LCSW Phone Number: 01/05/2021, 9:29 AM  Transition of Care Department [facility]) has reviewed patient and no TOC needs have been identified at this time. We will continue to monitor patient advancement through interdisciplinary progression rounds. If new patient transition needs arise, please place a TOC consult.

## 2021-01-05 NOTE — Interval H&P Note (Signed)
History and Physical Interval Note:  01/05/2021 11:10 AM  Wynetta Emery  has presented today for surgery, with the diagnosis of acute cholecystitis.  The various methods of treatment have been discussed with the patient and family. After consideration of risks, benefits and other options for treatment, the patient has consented to  Procedure(s): LAPAROSCOPIC CHOLECYSTECTOMY WITH INTRAOPERATIVE CHOLANGIOGRAM (N/A) as a surgical intervention.  The patient's history has been reviewed, patient examined, no change in status, stable for surgery.  I have reviewed the patient's chart and labs.  Questions were answered to the patient's satisfaction.   Pt seen examined and agree Proceed to laparoscopic cholecystectomy /possible cholangiogram   The procedure has been discussed with the patient. Operative and non operative treatments have been discussed. Risks of surgery include bleeding, infection,  Common bile duct injury,  Injury to the stomach,liver, colon,small intestine, abdominal wall,  Diaphragm,  Major blood vessels,  And the need for an open procedure.  Other risks include worsening of medical problems, death,  DVT and pulmonary embolism, and cardiovascular events.   Medical options have also been discussed. The patient has been informed of long term expectations of surgery and non surgical options,  The patient agrees to proceed.     Vahan Wadsworth A Darnelle Derrick

## 2021-01-05 NOTE — Op Note (Signed)
Laparoscopic Cholecystectomy with IOC Procedure Note  Indications: This patient presents with symptomatic gallbladder disease and will undergo laparoscopic cholecystectomy.The procedure has been discussed with the patient. Operative and non operative treatments have been discussed. Risks of surgery include bleeding, infection,  Common bile duct injury,  Injury to the stomach,liver, colon,small intestine, abdominal wall,  Diaphragm,  Major blood vessels,  And the need for an open procedure.  Other risks include worsening of medical problems, death,  DVT and pulmonary embolism, and cardiovascular events.   Medical options have also been discussed. The patient has been informed of long term expectations of surgery and non surgical options,  The patient agrees to proceed.     Pre-operative Diagnosis: Calculus of gallbladder with acute cholecystitis, without mention of obstruction  Post-operative Diagnosis: Same  Surgeon: Dortha Schwalbe  MD   Assistants: Bailey Mech PA-C   Anesthesia: General endotracheal anesthesia and Local anesthesia 0.25.% bupivacaine, with epinephrine  ASA Class: 2  Procedure Details  The patient was seen again in the Holding Room. The risks, benefits, complications, treatment options, and expected outcomes were discussed with the patient. The possibilities of reaction to medication, pulmonary aspiration, perforation of viscus, bleeding, recurrent infection, finding a normal gallbladder, the need for additional procedures, failure to diagnose a condition, the possible need to convert to an open procedure, and creating a complication requiring transfusion or operation were discussed with the patient. The patient and/or family concurred with the proposed plan, giving informed consent. The site of surgery properly noted/marked. The patient was taken to Operating Room, identified as Stephanie Schroeder and the procedure verified as Laparoscopic Cholecystectomy with Intraoperative  Cholangiograms. A Time Out was held and the above information confirmed.  Prior to the induction of general anesthesia, antibiotic prophylaxis was administered. General endotracheal anesthesia was then administered and tolerated well. After the induction, the abdomen was prepped in the usual sterile fashion. The patient was positioned in the supine position with the left arm comfortably tucked, along with some reverse Trendelenburg.  Local anesthetic agent was injected into the skin near the umbilicus and an incision made. The midline fascia was incised and the Hasson technique was used to introduce a 12 mm port under direct vision. It was secured with a figure of eight Vicryl suture placed in the usual fashion. Pneumoperitoneum was then created with CO2 and tolerated well without any adverse changes in the patient's vital signs. Additional trocars were introduced under direct vision with an 11 mm trocar in the epigastrium and 2 5 mm trocars in the right upper quadrant. All skin incisions were infiltrated with a local anesthetic agent before making the incision and placing the trocars.   The gallbladder was identified, the fundus grasped and retracted cephalad. Adhesions were lysed bluntly and with the electrocautery where indicated, taking care not to injure any adjacent organs or viscus. The infundibulum was grasped and retracted laterally, exposing the peritoneum overlying the triangle of Calot. This was then divided and exposed in a blunt fashion. The cystic duct was clearly identified and bluntly dissected circumferentially. The junctions of the gallbladder, cystic duct and common bile duct were clearly identified prior to the division of any linear structure.   An incision was made in the cystic duct and the cholangiogram catheter introduced. The catheter was secured using an endoclip. The study showed no stones and good visualization of the distal and proximal biliary tree. The catheter was then  removed.   The cystic duct was then  ligated with surgical clips  on the patient side and  and Endoloop  and divided. The cystic artery was identified, dissected free, ligated with clips and divided as well. Posterior cystic artery clipped and divided.  The gallbladder was dissected from the liver bed in retrograde fashion with the electrocautery. The gallbladder was removed. The liver bed was irrigated and inspected. Hemostasis was achieved with the electrocautery. Copious irrigation was utilized and was repeatedly aspirated until clear all particulate matter. Hemostasis was achieved with no signs  Of bleeding or bile leakage.  Pneumoperitoneum was completely reduced after viewing removal of the trocars under direct vision. The wound was thoroughly irrigated and the fascia was then closed with a figure of eight suture; the skin was then closed with 4 O monocryl  and a sterile dressing of Dermabond was applied.  Instrument, sponge, and needle counts were correct at closure and at the conclusion of the case.   Findings: Cholecystitis with Cholelithiasis  Estimated Blood Loss: less than 50 mL         Drains: none         Total IV Fluids per OR record          Specimens: Gallbladder           Complications: None; patient tolerated the procedure well.         Disposition: PACU - hemodynamically stable.         Condition: stable

## 2021-01-06 ENCOUNTER — Encounter (HOSPITAL_COMMUNITY): Payer: Self-pay | Admitting: Surgery

## 2021-01-06 MED ORDER — OXYCODONE HCL 5 MG PO TABS: 5.0000 mg | ORAL_TABLET | Freq: Four times a day (QID) | ORAL | 0 refills | Status: DC | PRN

## 2021-01-06 MED ORDER — ESCITALOPRAM OXALATE 20 MG PO TABS: 40.0000 mg | ORAL_TABLET | Freq: Every day | ORAL | Status: DC

## 2021-01-06 MED ORDER — ACETAMINOPHEN 325 MG PO TABS: 650.0000 mg | ORAL_TABLET | Freq: Four times a day (QID) | ORAL | Status: DC | PRN

## 2021-01-06 NOTE — Progress Notes (Signed)
Provided discharge education/instructions, all questions and concerns addressed. Pt not in acute distress, discharged home with belongings accompanied by her mother. 

## 2021-01-06 NOTE — Plan of Care (Signed)

## 2021-01-06 NOTE — Discharge Summary (Signed)
[facility] Discharge Summary   Patient ID: Stephanie Schroeder MRN: [id number] DOB/AGE: Jun 05, 1980 40 y.o.  Admit date: 01/04/2021 Discharge date: 01/06/2021  Admitting Diagnosis: Cholecystitis   Discharge Diagnosis Patient Active Problem List   Diagnosis Date Noted   Acute calculous cholecystitis 01/04/2021   Lymphadenopathy 07/23/2018   Healthcare maintenance 07/23/2018   Left axillary pain 07/10/2018   Breast pain, left 07/10/2018   Screening breast examination 06/25/2018    Consultants None   Imaging: DG Cholangiogram Operative  Result Date: 01/05/2021 CLINICAL DATA:  40 year old female with history of cholelithiasis. EXAM: INTRAOPERATIVE CHOLANGIOGRAM TECHNIQUE: Cholangiographic images from the C-arm fluoroscopic device were submitted for interpretation post-operatively. Please see the procedural report for the amount of contrast and the fluoroscopy time utilized. COMPARISON:  01/04/2021 FINDINGS: Intraoperative antegrade injection via the cystic duct which opacifies the common bile duct and central portions of the intrahepatic biliary tree. Contrast is visualized flowing freely into the duodenum. There are no filling defects. No significant intra or extrahepatic biliary ductal dilation. No apparent anomalous anatomical configuration of the biliary tree. IMPRESSION: No evidence of choledocholithiasis. Marliss Coots, MD [facility] Radiology Electronically Signed   By: Marliss Coots M.D.   On: 01/05/2021 12:39   CT ABDOMEN PELVIS W CONTRAST  Result Date: 01/04/2021 CLINICAL DATA:  A 40 year old female presents for evaluation of acute abdominal pain. Pending cholecystectomy by report. EXAM: CT ABDOMEN AND PELVIS WITH CONTRAST TECHNIQUE: Multidetector CT imaging of the abdomen and pelvis was performed using the standard protocol following bolus administration of intravenous contrast. CONTRAST:  36mL OMNIPAQUE IOHEXOL 350  MG/ML SOLN COMPARISON:  December 21, 2020. FINDINGS: Lower chest: Lung bases are clear without effusion or consolidative process. Hepatobiliary: Low-attenuation lesion in the LEFT hepatic lobe measuring 11 mm (image 14/2) compatible with small hepatic hemangioma. No focal, suspicious hepatic lesion with some fatty intensification along the fissure for the falciform ligament. The portal vein is patent. Hepatic veins are patent. The gallbladder is under distended without signs of adjacent stranding. Trace amount of pericholecystic fluid may be evident. Potentially present previously. No substantial biliary duct distension. Pancreas: Unremarkable. No pancreatic ductal dilatation or surrounding inflammatory changes. Spleen: Spleen normal size and contour. Adrenals/Urinary Tract: Adrenal glands are unremarkable. Symmetric renal enhancement. No sign of hydronephrosis. No suspicious renal lesion or perinephric stranding. Urinary bladder is grossly unremarkable. Small cyst in the RIGHT kidney. Stomach/Bowel: No acute process related to the gastrointestinal tract. Normal appendix. Vascular/Lymphatic: Aorta with smooth contours. IVC with smooth contours. No aneurysmal dilation of the abdominal aorta. There is no gastrohepatic or hepatoduodenal ligament lymphadenopathy. No retroperitoneal or mesenteric lymphadenopathy. No pelvic sidewall lymphadenopathy. Reproductive: Unremarkable by CT. Other: No substantial ascites.  No signs of free air. Musculoskeletal: No acute bone finding. No destructive bone process. Spinal degenerative changes. IMPRESSION: The gallbladder is under distended without signs of adjacent stranding. Trace amount of pericholecystic fluid is unchanged. If there is high clinical concern for acute cholecystitis could consider HIDA scan for further evaluation though the gallbladder is not substantially distended on the current exam. Low-attenuation lesion in the LEFT hepatic lobe measuring 11 mm compatible with  small hepatic hemangioma. Normal appendix. Electronically Signed   By: Donzetta Kohut M.D.   On: 01/04/2021 16:09    Procedures Dr. Maisie Fus Cornett (01/05/21) - laparoscopic cholecystectomy with [facility]  Hospital Course:  40 year old female with known symptomatic cholelithiasis, seen previously in the [facility] [place] [facility] by Dr. Daphine Deutscher, who presented to the emergency department with severe  epigastric and right upper quadrant pain.  She had a leukocytosis in the emergency department along with nausea.  She was admitted for management of cholecystitis.  She underwent the above operation.  Tolerated procedure well and was transferred to the floor.  Diet was advanced as tolerated.  On POD#1, the patient was voiding well, tolerating diet, ambulating well, pain well controlled, vital signs stable, incisions c/d/i and felt stable for discharge home.  Patient will follow up in our office in 2 weeks and knows to call with questions or concerns.   I have personally reviewed the patients medication history on the [place] controlled substance database.  Physical Exam: General:  Alert, NAD, pleasant, comfortable Abd:  Soft, ND, mild tenderness, incisions C/D/I  Allergies as of 01/06/2021       Reactions   Risperidone And Related Swelling   Tongue swelling and throat over 30 years ago   Hydrocodone Nausea And Vomiting        Medication List     STOP taking these medications    oxyCODONE-acetaminophen 5-325 MG tablet Commonly known as: Percocet       TAKE these medications    acetaminophen 325 MG tablet Commonly known as: TYLENOL Take 2-3 tablets (650-975 mg total) by mouth every 6 (six) hours as needed for mild pain, headache or fever (or temp > 100). Notes to patient: Last dose given 12/13 01:35pm   cetirizine 10 MG tablet Commonly known as: ZYRTEC Take 10 mg by mouth daily as needed for allergies. Notes to patient: Resume home regimen   escitalopram 20 MG tablet Commonly known  as: LEXAPRO Take 2 tablets (40 mg total) by mouth daily. What changed: how much to take   MULTIVITAMIN GUMMIES WOMENS PO Take 2 tablets by mouth daily. Notes to patient: Resume home regimen   ondansetron 4 MG disintegrating tablet Commonly known as: ZOFRAN-ODT Take 1 tablet (4 mg total) by mouth every 8 (eight) hours as needed for nausea or vomiting. Notes to patient: Last dose given 11:34am   oxyCODONE 5 MG immediate release tablet Commonly known as: Oxy IR/ROXICODONE Take 1 tablet (5 mg total) by mouth every 6 (six) hours as needed for moderate pain or severe pain (not releived by tylenol or ibuprofen). Notes to patient: Last dose given 12/14 05:38am          Follow-up Information     [facility] Follow up.   Specialty: General Surgery Why: our office is scheduling you for post-operative follow up. please call to confirm appointment date/time. Contact information: [address] [place] [address] [phone]                Signed: Hosie Spangle, [facility] 01/06/2021, 12:46 PM

## 2021-01-06 NOTE — Anesthesia Postprocedure Evaluation (Signed)
Anesthesia Post Note  Patient: Stephanie Schroeder  Procedure(s) Performed: LAPAROSCOPIC CHOLECYSTECTOMY WITH INTRAOPERATIVE CHOLANGIOGRAM (Abdomen)     Patient location during evaluation: PACU Anesthesia Type: General Level of consciousness: awake and alert Pain management: pain level controlled Vital Signs Assessment: post-procedure vital signs reviewed and stable Respiratory status: spontaneous breathing, nonlabored ventilation, respiratory function stable and patient connected to nasal cannula oxygen Cardiovascular status: blood pressure returned to baseline and stable Postop Assessment: no apparent nausea or vomiting Anesthetic complications: no   No notable events documented.  Last Vitals:  Vitals:   01/05/21 2202 01/06/21 0451  BP: 101/60 97/65  Pulse: 71 63  Resp: 16 16  Temp: 36.8 C 36.8 C  SpO2: 96% 98%    Last Pain:  Vitals:   01/06/21 0623  TempSrc:   PainSc: Asleep                 Nelle Don Jericho Alcorn

## 2021-01-07 LAB — SURGICAL PATHOLOGY

## 2021-01-19 ENCOUNTER — Ambulatory Visit: Admit: 2021-01-19 | Payer: No Typology Code available for payment source | Admitting: Surgery

## 2021-01-19 SURGERY — LAPAROSCOPIC CHOLECYSTECTOMY
Anesthesia: General

## 2021-01-21 ENCOUNTER — Encounter (HOSPITAL_COMMUNITY): Payer: Self-pay | Admitting: Radiology

## 2021-02-10 ENCOUNTER — Ambulatory Visit: Payer: No Typology Code available for payment source | Admitting: Nurse Practitioner

## 2021-02-10 ENCOUNTER — Other Ambulatory Visit: Payer: Self-pay

## 2021-02-10 ENCOUNTER — Encounter: Payer: Self-pay | Admitting: Nurse Practitioner

## 2021-02-10 VITALS — BP 94/64 | HR 67 | Temp 97.9°F | Ht 64.0 in | Wt 134.1 lb

## 2021-02-10 DIAGNOSIS — J329 Chronic sinusitis, unspecified: Secondary | ICD-10-CM | POA: Diagnosis not present

## 2021-02-10 DIAGNOSIS — J0140 Acute pansinusitis, unspecified: Secondary | ICD-10-CM

## 2021-02-10 LAB — POCT INFLUENZA A/B
Influenza A, POC: NEGATIVE
Influenza B, POC: NEGATIVE

## 2021-02-10 MED ORDER — AZITHROMYCIN 250 MG PO TABS: ORAL_TABLET | ORAL | 0 refills | Status: DC

## 2021-02-10 NOTE — Progress Notes (Signed)
Established patient visit   Patient: Stephanie Schroeder   DOB: Dec 27, 1980   41 y.o. Female  MRN: [id number] Visit Date: 02/10/2021  Chief Complaint  Patient presents with   Nasal Congestion   Subjective    The patient is here for acute visit. She started with sinus infection type symptoms yesterday. She did take a home test for COVID 19 this morning and results were negative.   URI  This is a new problem. The current episode started yesterday. There has been no fever. Associated symptoms include congestion, coughing, ear pain, headaches, joint pain, neck pain, rhinorrhea, sinus pain, a sore throat and swollen glands. Pertinent negatives include no abdominal pain, chest pain, diarrhea, dysuria, nausea, rash, sneezing, vomiting or wheezing. Associated symptoms comments: Patient had episode of vertigo this morning . She has tried nothing for the symptoms. The treatment provided mild relief.     Medications: Outpatient Medications Prior to Visit  Medication Sig   cetirizine (ZYRTEC) 10 MG tablet Take 10 mg by mouth daily as needed for allergies.   escitalopram (LEXAPRO) 20 MG tablet Take 2 tablets (40 mg total) by mouth daily.   Multiple Vitamins-Minerals (MULTIVITAMIN GUMMIES WOMENS PO) Take 2 tablets by mouth daily.   [DISCONTINUED] acetaminophen (TYLENOL) 325 MG tablet Take 2-3 tablets (650-975 mg total) by mouth every 6 (six) hours as needed for mild pain, headache or fever (or temp > 100).   [DISCONTINUED] ondansetron (ZOFRAN-ODT) 4 MG disintegrating tablet Take 1 tablet (4 mg total) by mouth every 8 (eight) hours as needed for nausea or vomiting.   [DISCONTINUED] oxyCODONE (OXY IR/ROXICODONE) 5 MG immediate release tablet Take 1 tablet (5 mg total) by mouth every 6 (six) hours as needed for moderate pain or severe pain (not releived by tylenol or ibuprofen).   No facility-administered medications prior to visit.    Review of Systems  Constitutional:  Positive for fatigue. Negative for  activity change, appetite change, chills and fever.  HENT:  Positive for congestion, ear pain, postnasal drip, rhinorrhea, sinus pressure, sinus pain and sore throat. Negative for sneezing.   Eyes: Negative.   Respiratory:  Positive for cough. Negative for chest tightness, shortness of breath and wheezing.   Cardiovascular:  Negative for chest pain and palpitations.  Gastrointestinal:  Negative for abdominal pain, constipation, diarrhea, nausea and vomiting.  Endocrine: Negative for cold intolerance, heat intolerance, polydipsia and polyuria.  Genitourinary:  Negative for dyspareunia, dysuria, flank pain, frequency and urgency.  Musculoskeletal:  Positive for joint pain and neck pain. Negative for arthralgias, back pain and myalgias.  Skin:  Negative for rash.  Allergic/Immunologic: Negative for environmental allergies.  Neurological:  Positive for headaches. Negative for dizziness and weakness.  Hematological:  Positive for adenopathy.  Psychiatric/Behavioral:  The patient is not nervous/anxious.     Objective     Today's Vitals   02/10/21 1055  BP: 94/64  Pulse: 67  Temp: 97.9 F (36.6 C)  SpO2: 99%  Weight: 134 lb 1.6 oz (60.8 kg)  Height: 5\' 4"  (1.626 m)   Body mass index is 23.02 kg/m.   Physical Exam Vitals and nursing note reviewed.  Constitutional:      Appearance: Normal appearance. She is well-developed.  HENT:     Head: Normocephalic and atraumatic.     Right Ear: Tympanic membrane is erythematous and bulging.     Left Ear: Tympanic membrane is erythematous and bulging.     Nose: Congestion present.     Right Sinus: Maxillary sinus tenderness and  frontal sinus tenderness present.     Left Sinus: Maxillary sinus tenderness and frontal sinus tenderness present.     Mouth/Throat:     Pharynx: Posterior oropharyngeal erythema present.  Eyes:     Pupils: Pupils are equal, round, and reactive to light.  Cardiovascular:     Rate and Rhythm: Normal rate and regular  rhythm.     Pulses: Normal pulses.     Heart sounds: Normal heart sounds.  Pulmonary:     Effort: Pulmonary effort is normal.     Breath sounds: Normal breath sounds.  Abdominal:     Palpations: Abdomen is soft.  Musculoskeletal:        General: Normal range of motion.     Cervical back: Normal range of motion and neck supple.  Lymphadenopathy:     Cervical: Cervical adenopathy present.  Skin:    General: Skin is warm and dry.     Capillary Refill: Capillary refill takes less than 2 seconds.  Neurological:     General: No focal deficit present.     Mental Status: She is alert and oriented to person, place, and time.  Psychiatric:        Mood and Affect: Mood normal.        Behavior: Behavior normal.        Thought Content: Thought content normal.        Judgment: Judgment normal.      Results for orders placed or performed in visit on 02/10/21  POCT Influenza A/B  Result Value Ref Range   Influenza A, POC Negative Negative   Influenza B, POC Negative Negative    Assessment & Plan     1. Acute non-recurrent pansinusitis Patient negative for flu today.  Home test for COVID-19 was negative today.  Start Z-Pak.  Take as directed for 5 days. Rest and increase fluids. Continue using OTC medication to control symptoms.   - azithromycin (ZITHROMAX) 250 MG tablet; z-pack - take as directed for 5 days  Dispense: 6 tablet; Refill: 0 - POCT Influenza A/B    Return for prn worsening or persistent symptoms.        Carlean Jews, NP  [facility] at [facility] [phone] (phone) [phone] (fax)  [facility]

## 2021-08-20 IMAGING — DX DG WRIST COMPLETE 3+V*L*
4 series · 4 of 4 positions shown · non-contrast
Comparison: None.

CLINICAL DATA: Pain in left thumb and anatomic snuffbox after
injury at work today, swelling

EXAM:
LEFT WRIST - COMPLETE 3+ VIEW

[wrist pa]
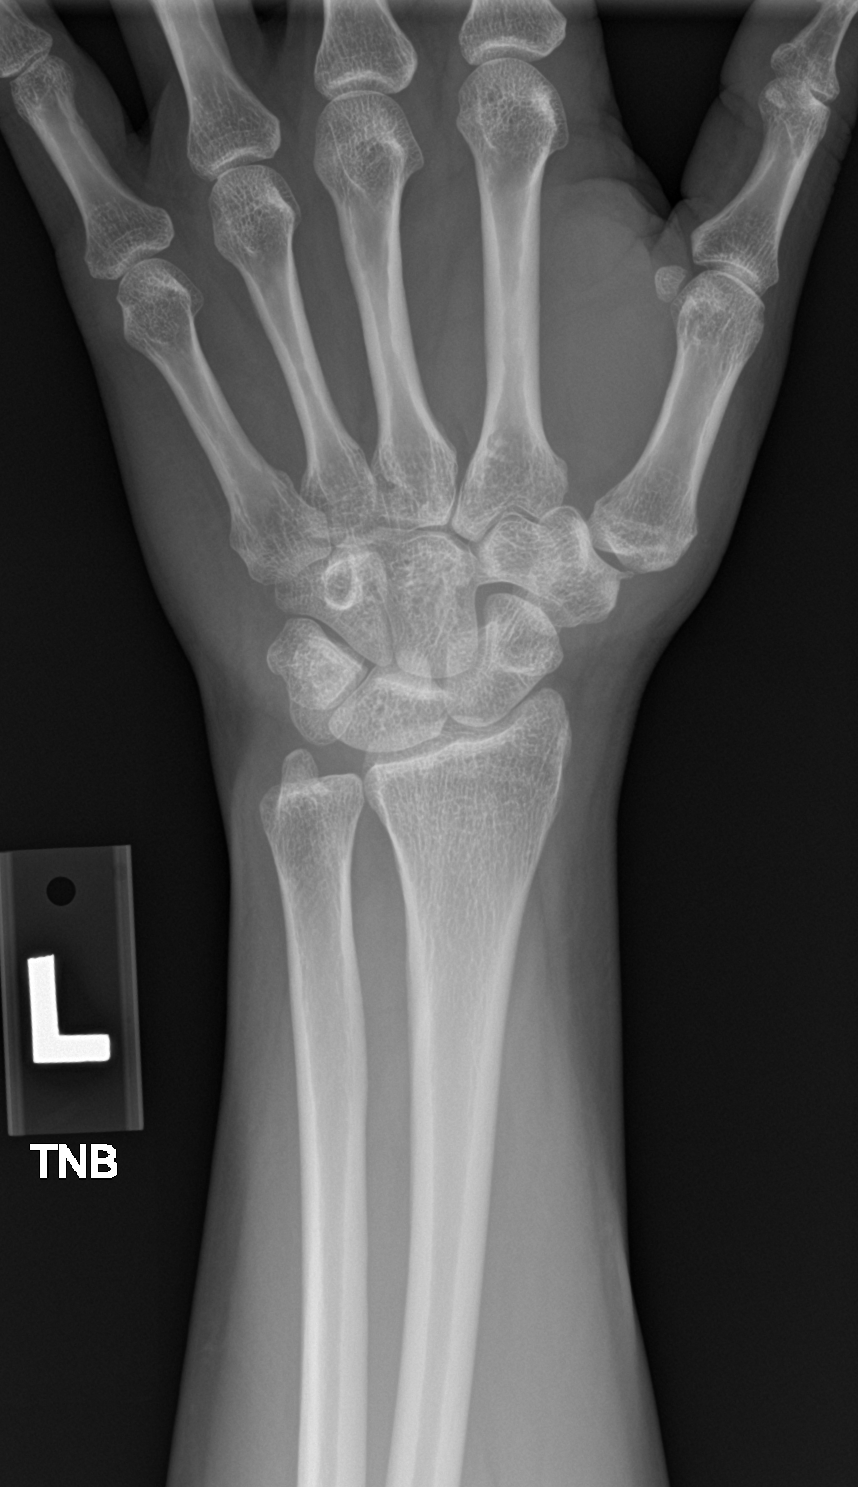

[wrist navicular]
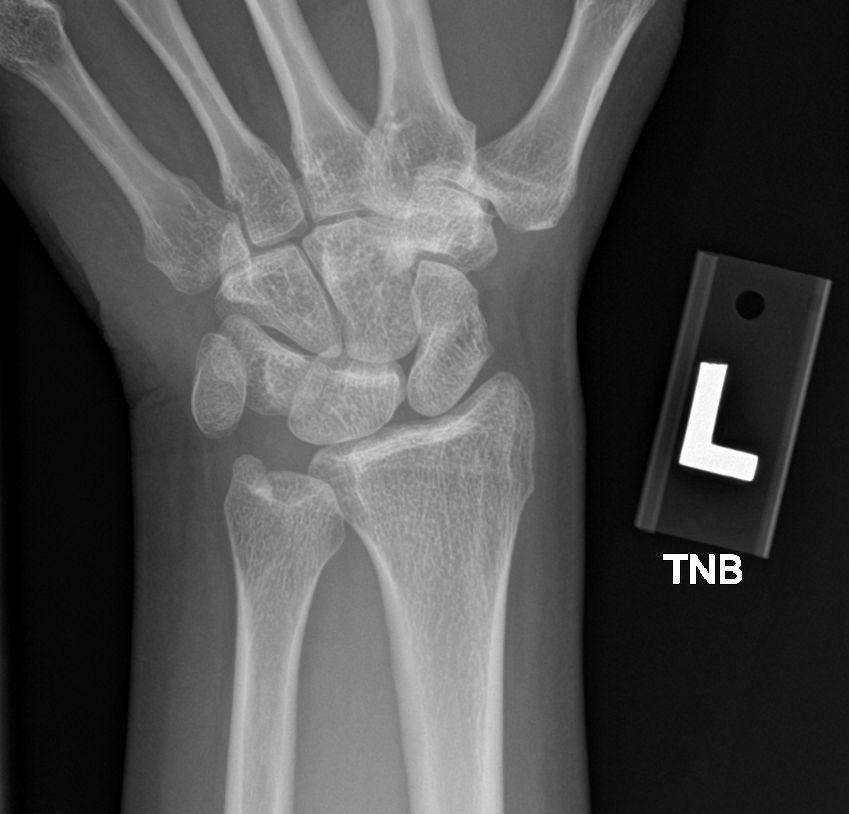

[wrist obl]
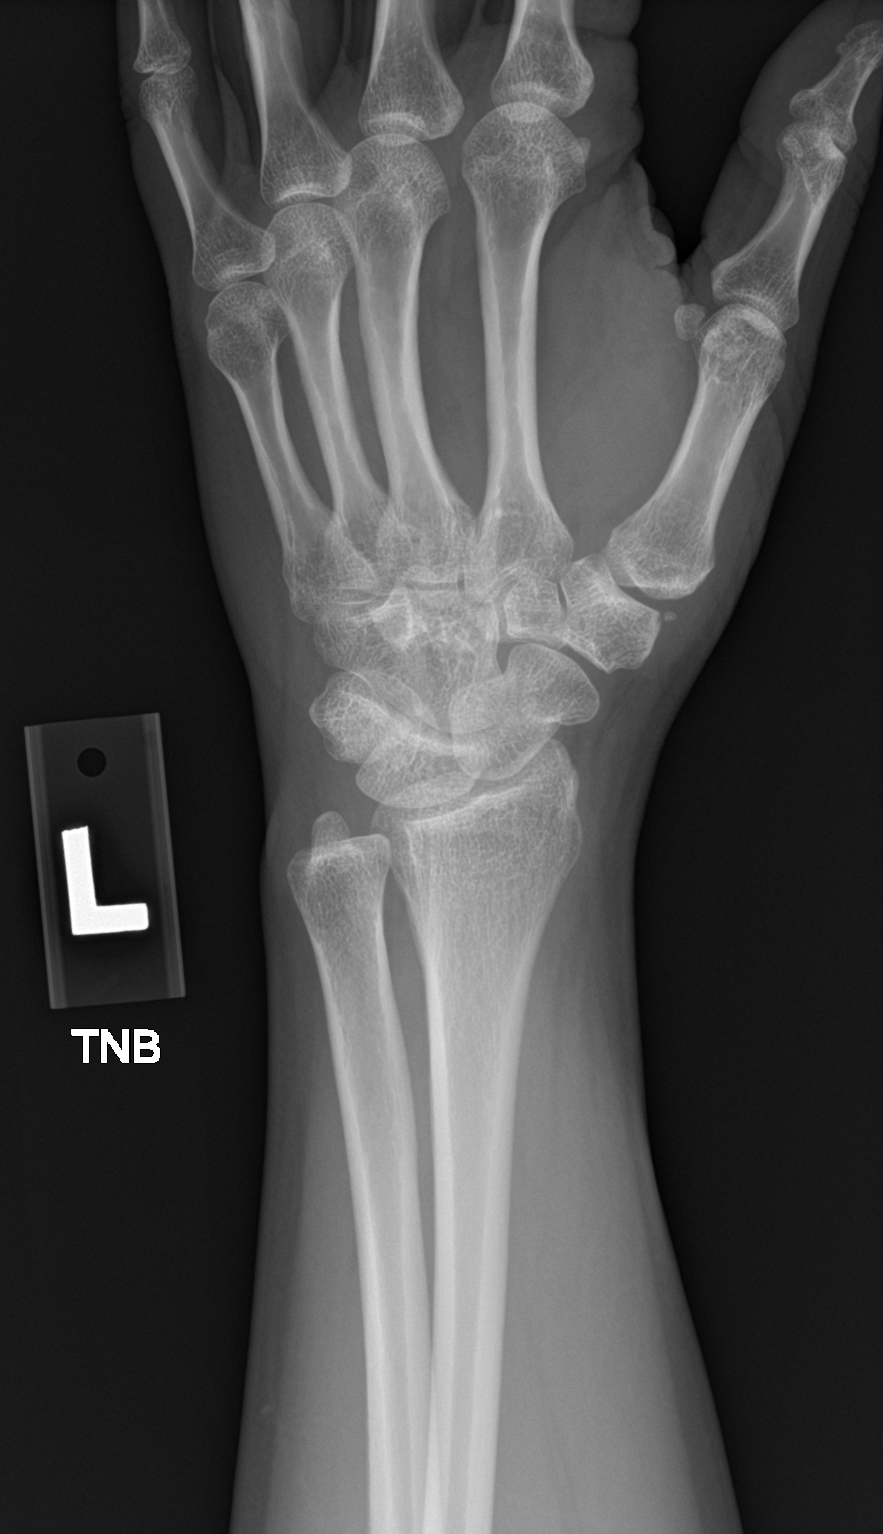

[wrist lat]
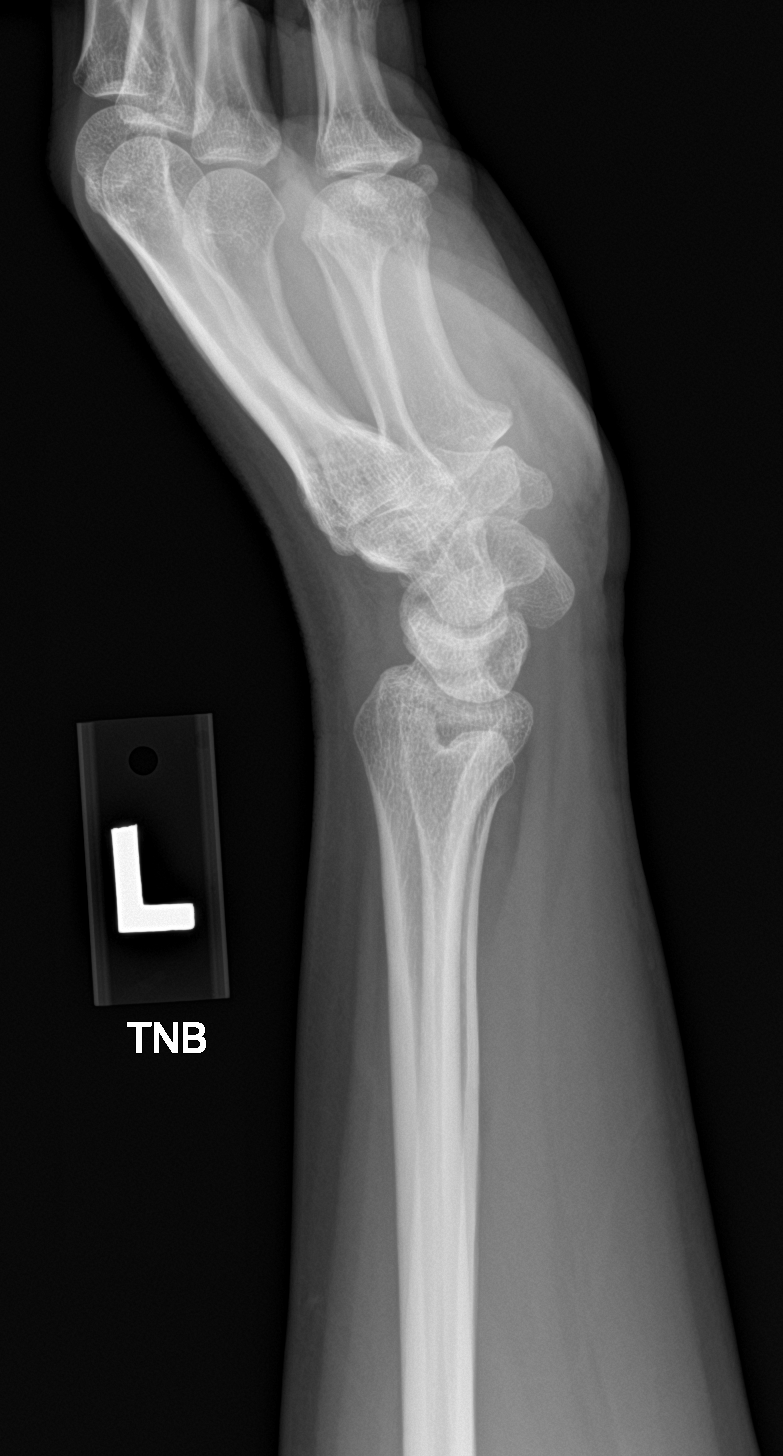

[4 of 4 positions shown; findings below may reference images not displayed]

FINDINGS: Frontal, oblique, lateral, and ulnar deviated views of the left
wrist are obtained. No fracture, subluxation, or dislocation. There
is joint space narrowing and osteophyte formation of the first
carpometacarpal joint, compatible with osteoarthritis. Soft tissues
are unremarkable.
IMPRESSION: 1. Osteoarthritis of the first carpometacarpal joint.
2. No acute bony abnormality.

## 2022-01-11 LAB — OB RESULTS CONSOLE GC/CHLAMYDIA
Chlamydia: NEGATIVE
Neisseria Gonorrhea: NEGATIVE

## 2022-01-12 LAB — OB RESULTS CONSOLE HEPATITIS B SURFACE ANTIGEN: Hepatitis B Surface Ag: NEGATIVE

## 2022-01-12 LAB — HEPATITIS C ANTIBODY: HCV Ab: NEGATIVE

## 2022-01-12 LAB — OB RESULTS CONSOLE RUBELLA ANTIBODY, IGM: Rubella: IMMUNE

## 2022-01-24 NOTE — L&D Delivery Note (Signed)
DELIVERY NOTE  Pt complete and at +2 station with urge to push. Epidural controlling pain. Pt pushed and delivered a viable female infant in ROA/compound with posterior arm position. Loose nuchal reduced. X1. Anterior and posterior shoulders spontaneously delivered with next two pushes; body easily followed next. Infant placed on mothers abdomen and bulb suction of mouth and nose performed. Cord was then clamped and cut by FOB. Cord blood obtained, 3VC. Baby had a vigorous spontaneous cry noted. Placenta then delivered at 1505 intact. Fundal massage performed and pitocin per protocol. Uterine atony noted with brisk bleeding. Manual sweep x1, no membrane. Bimanual massage, TXA administered as well as IM methergine. Improved uterine tone. The following lacerations were noted: 1st deg. Repaired in routine fashion with 2-0 vicryl. Final EBL 1038cc. Mother and baby stable. Counts correct   Infant time: 1502 Gender: female Placenta time: 1505 Apgars: 9/9 Weight: pending skin-to-skin

## 2022-03-18 ENCOUNTER — Telehealth: Payer: Self-pay | Admitting: Hematology

## 2022-03-18 NOTE — Telephone Encounter (Signed)
Scheduled appt per 2/23 referral. Pt is aware of appt date and time. Pt is aware to arrive 15 mins prior to appt time and to bring and updated insurance card. Pt is aware of appt location.   °

## 2022-03-24 ENCOUNTER — Inpatient Hospital Stay: Payer: BC Managed Care – PPO

## 2022-03-24 ENCOUNTER — Inpatient Hospital Stay: Payer: BC Managed Care – PPO | Attending: Hematology | Admitting: Hematology

## 2022-03-24 ENCOUNTER — Other Ambulatory Visit: Payer: Self-pay

## 2022-03-24 VITALS — BP 117/72 | HR 96 | Temp 98.1°F | Resp 20 | Wt 167.1 lb

## 2022-03-24 DIAGNOSIS — Z79899 Other long term (current) drug therapy: Secondary | ICD-10-CM | POA: Insufficient documentation

## 2022-03-24 DIAGNOSIS — Z8249 Family history of ischemic heart disease and other diseases of the circulatory system: Secondary | ICD-10-CM

## 2022-03-24 DIAGNOSIS — R0981 Nasal congestion: Secondary | ICD-10-CM | POA: Diagnosis not present

## 2022-03-24 DIAGNOSIS — D72829 Elevated white blood cell count, unspecified: Secondary | ICD-10-CM | POA: Insufficient documentation

## 2022-03-24 DIAGNOSIS — Z3A20 20 weeks gestation of pregnancy: Secondary | ICD-10-CM

## 2022-03-24 DIAGNOSIS — R590 Localized enlarged lymph nodes: Secondary | ICD-10-CM

## 2022-03-24 DIAGNOSIS — O99112 Other diseases of the blood and blood-forming organs and certain disorders involving the immune mechanism complicating pregnancy, second trimester: Secondary | ICD-10-CM | POA: Insufficient documentation

## 2022-03-24 DIAGNOSIS — Z87891 Personal history of nicotine dependence: Secondary | ICD-10-CM | POA: Diagnosis not present

## 2022-03-24 LAB — CBC WITH DIFFERENTIAL (CANCER CENTER ONLY)
Abs Immature Granulocytes: 0.42 10*3/uL — ABNORMAL HIGH (ref 0.00–0.07)
Basophils Absolute: 0.1 10*3/uL (ref 0.0–0.1)
Basophils Relative: 1 %
Eosinophils Absolute: 0.2 10*3/uL (ref 0.0–0.5)
Eosinophils Relative: 1 %
HCT: 36.2 % (ref 36.0–46.0)
Hemoglobin: 12.1 g/dL (ref 12.0–15.0)
Immature Granulocytes: 2 %
Lymphocytes Relative: 12 %
Lymphs Abs: 2.5 10*3/uL (ref 0.7–4.0)
MCH: 31.8 pg (ref 26.0–34.0)
MCHC: 33.4 g/dL (ref 30.0–36.0)
MCV: 95.3 fL (ref 80.0–100.0)
Monocytes Absolute: 1.4 10*3/uL — ABNORMAL HIGH (ref 0.1–1.0)
Monocytes Relative: 7 %
Neutro Abs: 15.8 10*3/uL — ABNORMAL HIGH (ref 1.7–7.7)
Neutrophils Relative %: 77 %
Platelet Count: 344 10*3/uL (ref 150–400)
RBC: 3.8 MIL/uL — ABNORMAL LOW (ref 3.87–5.11)
RDW: 13.6 % (ref 11.5–15.5)
WBC Count: 20.4 10*3/uL — ABNORMAL HIGH (ref 4.0–10.5)
nRBC: 0 % (ref 0.0–0.2)

## 2022-03-24 LAB — CMP (CANCER CENTER ONLY)
ALT: 19 U/L (ref 0–44)
AST: 13 U/L — ABNORMAL LOW (ref 15–41)
Albumin: 3.5 g/dL (ref 3.5–5.0)
Alkaline Phosphatase: 60 U/L (ref 38–126)
Anion gap: 5 (ref 5–15)
BUN: 5 mg/dL — ABNORMAL LOW (ref 6–20)
CO2: 26 mmol/L (ref 22–32)
Calcium: 8.5 mg/dL — ABNORMAL LOW (ref 8.9–10.3)
Chloride: 107 mmol/L (ref 98–111)
Creatinine: 0.37 mg/dL — ABNORMAL LOW (ref 0.44–1.00)
GFR, Estimated: >60 mL/min (ref >60)
Glucose, Bld: 74 mg/dL (ref 70–99)
Potassium: 3.8 mmol/L (ref 3.5–5.1)
Sodium: 138 mmol/L (ref 135–145)
Total Bilirubin: 0.2 mg/dL — ABNORMAL LOW (ref 0.3–1.2)
Total Protein: 5.8 g/dL — ABNORMAL LOW (ref 6.5–8.1)

## 2022-03-24 LAB — FERRITIN: Ferritin: 42 ng/mL (ref 11–307)

## 2022-03-24 LAB — VITAMIN B12: Vitamin B-12: 203 pg/mL (ref 180–914)

## 2022-03-24 NOTE — Progress Notes (Signed)
HEMATOLOGY/ONCOLOGY CLINIC NOTE  Date of Service: 03/24/22   Patient Care Team: Ronnell Freshwater, NP as PCP - General (Family Medicine) Brunetta Genera, MD as Consulting Physician (Hematology)  CHIEF COMPLAINTS/PURPOSE OF CONSULTATION:  Evaluation and management of leukocytosis   HISTORY OF PRESENTING ILLNESS:   Stephanie Schroeder is a wonderful 42 y.o. female who has been referred to [place] by Dr. Carlynn Purl for evaluation and management of leucocytosis in pregnancy.  Today, she confirms that she is [redacted] weeks pregnant. Her last CBC from 03/16/2022 revealed WBC of 18.7K, neutrophil 13.9, hemoglobin of 12.1, no anemia, and normal platelet level of 370K. She confirms that she previously had elevated WBC in the past.  She reports that during her COVID-19 infection in December 2023, she took Tylenol and Paxlovid. She describes that her infection was short-lived and okay overall. She denies any lingering cough and she reports that she was never treated for a bacterial infection. She has not previously ben on any steroids.   She reports that her congestion related to her allergies is stable and she manages it with Zyrtec. Her seasonal allergies began when she was in her 92s.  She does report that she experienced a tooth ache from a broken tooth in her bottom right mouth last night, but her pain has since resolved. She plans on going to the dentist soon. She is not aware of grinding her teeth at night.  She denies any new bone pain, other sinus infection, discomfort passing urine, persistent cough, or current tooth pain. She reports no concern for any ingrown hairs or any vaginal bacterial symptoms. She denies any unexplained fevers, chills, or new lumps/bumps.  She does note that she may feel some mild enlarged lymph nodes upon palpation. She reports that she previously had a cholecystectomy in 2022.  MEDICAL HISTORY:  Past Medical History:  Diagnosis Date   Bipolar 1 disorder ([place])     Depression    Gallstones    ROM (rupture of membranes), premature 07/17/2013   SVD (spontaneous vaginal delivery) 07/17/2013    SURGICAL HISTORY: Past Surgical History:  Procedure Laterality Date   CHOLECYSTECTOMY N/A 01/05/2021   Procedure: LAPAROSCOPIC CHOLECYSTECTOMY WITH INTRAOPERATIVE CHOLANGIOGRAM;  Surgeon: Erroll Luna, MD;  Location: [facility];  Service: General;  Laterality: N/A;   UPPER GI ENDOSCOPY      SOCIAL HISTORY: Social History   Socioeconomic History   Marital status: Single    Spouse name: Not on file   Number of children: 1   Years of education: Not on file   Highest education level: Bachelor's degree (e.g., BA, AB, BS)  Occupational History   Not on file  Tobacco Use   Smoking status: Former    Packs/day: 0.50    Types: Cigarettes    Quit date: 07/17/2017    Years since quitting: 4.6   Smokeless tobacco: Former  Scientific laboratory technician Use: Never used  Substance and Sexual Activity   Alcohol use: No   Drug use: No    Comment: History of cannibus use   Sexual activity: Yes  Other Topics Concern   Not on file  Social History Narrative   Not on file   Social Determinants of Health   Financial Resource Strain: Not on file  Food Insecurity: No Food Insecurity (03/24/2022)   Hunger Vital Sign    Worried About Running Out of Food in the Last Year: Never true    [place] in the Last Year:  Never true  Transportation Needs: No Transportation Needs (03/24/2022)   PRAPARE - Hydrologist (Medical): No    Lack of Transportation (Non-Medical): No  Physical Activity: Not on file  Stress: Not on file  Social Connections: Not on file  Intimate Partner Violence: Not At Risk (03/24/2022)   Humiliation, Afraid, Rape, and Kick questionnaire    Fear of Current or Ex-Partner: No    Emotionally Abused: No    Physically Abused: No    Sexually Abused: No    FAMILY HISTORY: Family History  Problem Relation Age of Onset    Hypertension Mother    Alcohol abuse Neg Hx    Arthritis Neg Hx    Asthma Neg Hx    Birth defects Neg Hx    Cancer Neg Hx    COPD Neg Hx    Depression Neg Hx    Diabetes Neg Hx    Drug abuse Neg Hx    Early death Neg Hx    Hearing loss Neg Hx    Heart disease Neg Hx    Hyperlipidemia Neg Hx    Kidney disease Neg Hx    Learning disabilities Neg Hx    Mental illness Neg Hx    Mental retardation Neg Hx    Miscarriages / Stillbirths Neg Hx    Stroke Neg Hx    Vision loss Neg Hx    Varicose Veins Neg Hx     ALLERGIES:  is allergic to risperidone and related and hydrocodone.  MEDICATIONS:  Current Outpatient Medications  Medication Sig Dispense Refill   cetirizine (ZYRTEC) 10 MG tablet Take 10 mg by mouth daily as needed for allergies.     folic acid (FOLVITE) 1 MG tablet Take 5 mg by mouth daily.     Multiple Vitamins-Minerals (MULTIVITAMIN GUMMIES WOMENS PO) Take 2 tablets by mouth daily.     azithromycin (ZITHROMAX) 250 MG tablet z-pack - take as directed for 5 days (Patient not taking: Reported on 03/24/2022) 6 tablet 0   escitalopram (LEXAPRO) 20 MG tablet Take 2 tablets (40 mg total) by mouth daily. (Patient not taking: Reported on 03/24/2022)     No current facility-administered medications for this visit.    REVIEW OF SYSTEMS:    10 Point review of Systems was done is negative except as noted above.  PHYSICAL EXAMINATION: ECOG PERFORMANCE STATUS: 0 - Asymptomatic  . Vitals:   03/24/22 0930  BP: 117/72  Pulse: 96  Resp: 20  Temp: 98.1 F (36.7 C)  SpO2: 99%    Filed Weights   03/24/22 0930  Weight: 167 lb 1.6 oz (75.8 kg)    .Body mass index is 28.68 kg/m.  GENERAL:alert, in no acute distress and comfortable SKIN: no acute rashes, no significant lesions EYES: conjunctiva are pink and non-injected, sclera anicteric OROPHARYNX: MMM, no exudates, no oropharyngeal erythema or ulceration NECK: supple, no JVD LYMPH:  no palpable lymphadenopathy in the  cervical, axillary or inguinal regions LUNGS: clear to auscultation b/l with normal respiratory effort HEART: regular rate & rhythm ABDOMEN:  normoactive bowel sounds , non tender, not distended.no palpable hepatosplenomegaly Extremity: no pedal edema PSYCH: alert & oriented x 3 with fluent speech NEURO: no focal motor/sensory deficits   LABORATORY DATA:  I have reviewed the data as listed  .    Latest Ref Rng & Units 03/24/2022   10:06 AM 01/04/2021   11:27 PM 01/04/2021    1:28 PM  CBC  WBC 4.0 -  10.5 K/uL 20.4  11.2  12.1   Hemoglobin 12.0 - 15.0 g/dL 12.1  14.1  14.9   Hematocrit 36.0 - 46.0 % 36.2  43.1  44.9   Platelets 150 - 400 K/uL 344  363  373    .CBC    Component Value Date/Time   WBC 20.4 (H) 03/24/2022 1006   WBC 11.2 (H) 01/04/2021 2327   RBC 3.80 (L) 03/24/2022 1006   HGB 12.1 03/24/2022 1006   HGB 14.0 01/15/2019 0839   HGB 12.9 08/19/2013 1540   HCT 36.2 03/24/2022 1006   HCT 41.8 01/15/2019 0839   HCT 39.9 08/19/2013 1540   PLT 344 03/24/2022 1006   PLT 393 01/15/2019 0839   MCV 95.3 03/24/2022 1006   MCV 93 01/15/2019 0839   MCV 91.1 08/19/2013 1540   MCH 31.8 03/24/2022 1006   MCHC 33.4 03/24/2022 1006   RDW 13.6 03/24/2022 1006   RDW 12.5 01/15/2019 0839   RDW 15.6 (H) 08/19/2013 1540   LYMPHSABS 2.5 03/24/2022 1006   LYMPHSABS 2.2 01/15/2019 0839   LYMPHSABS 2.8 08/19/2013 1540   MONOABS 1.4 (H) 03/24/2022 1006   MONOABS 0.7 08/19/2013 1540   EOSABS 0.2 03/24/2022 1006   EOSABS 0.2 01/15/2019 0839   BASOSABS 0.1 03/24/2022 1006   BASOSABS 0.1 01/15/2019 0839   BASOSABS 0.1 08/19/2013 1540    .    Latest Ref Rng & Units 03/24/2022   10:06 AM 01/04/2021   11:27 PM 01/04/2021    1:28 PM  CMP  Glucose 70 - 99 mg/dL 74   91   BUN 6 - 20 mg/dL 5   10   Creatinine 0.44 - 1.00 mg/dL 0.37  0.65  0.59   Sodium 135 - 145 mmol/L 138   139   Potassium 3.5 - 5.1 mmol/L 3.8   4.5   Chloride 98 - 111 mmol/L 107   107   CO2 22 - 32 mmol/L 26    26   Calcium 8.9 - 10.3 mg/dL 8.5   9.4   Total Protein 6.5 - 8.1 g/dL 5.8   7.0   Total Bilirubin 0.3 - 1.2 mg/dL 0.2   0.3   Alkaline Phos 38 - 126 U/L 60   50   AST 15 - 41 U/L 13   12   ALT 0 - 44 U/L 19   12    CBC 03/18/22:   RADIOGRAPHIC STUDIES: I have personally reviewed the radiological images as listed and agreed with the findings in the report. No results found.   ASSESSMENT & PLAN:   Wonderful 42 y/o female wiho is at [redacted] weeks gestation with h/o previous pregnancy related leucocytosis.neutrophilia which resolved after that pregnancy now with   1) Leucocytosis -- predominantly neutrophilia + mild monocytosis. Likely pregnancy related. No overt symptoms suggestive of infection. No steroid use  PLAN:  -Discussed lab results from 03/16/22 with patient. CBC revealed WBC of 18.7K, hemoglobin of 12.1, and platelets of 370K -Neutrophil level 13.9 CBC today with WBC -Discussed that patient may have an exaggerated inflammation response to infection. Also, discussed that pregnancy may increase WBC -educated patient that generally in the first trimester, monocytes are elevate. Additionally, lymphocytes typically drop in the first and second trimester, then normalize in third. Patient's lymphocytes normal at this time. -Discussed several reasons that neutrophils may be elevated, including inflammation and pregnancy. Could initially be inflammation in uterine lining, hormonal changes, or re-distribution of neutrophils in the body. -need to consider how  far we go with the work-up -Discussed that there may be increased  RBC production to keep up with increased blood volume. Mild anemia may be present in third trimester. This could also increase the WBCs. -recommend molecular studies to rule out any MPN's -Advised patient to monitor for any signs of infection, such as dental, vaginal, or urinary -order molecular labs today  . Orders Placed This Encounter  Procedures   CBC with  Differential ([facility] Only)    Standing Status:   Future    Number of Occurrences:   1    Standing Expiration Date:   03/24/2023   BCR ABL1 FISH ([facility])    Standing Status:   Future    Number of Occurrences:   1    Standing Expiration Date:   03/24/2023   JAK2 (INCLUDING V617F AND EXON 12), MPL,& CALR W/RFL MPN PANEL ([facility])    Standing Status:   Future    Number of Occurrences:   1    Standing Expiration Date:   03/24/2023   CMP ([place] only)    Standing Status:   Future    Number of Occurrences:   1    Standing Expiration Date:   03/24/2023   Ferritin    Standing Status:   Future    Number of Occurrences:   1    Standing Expiration Date:   03/24/2023   Vitamin B12    Standing Status:   Future    Number of Occurrences:   1    Standing Expiration Date:   03/24/2023    FOLLOW-UP: Labs today Phone visit in 2 weeks with Dr Irene Limbo  The total time spent in the appointment was 45 minutes* .  All of the patient's questions were answered with apparent satisfaction. The patient knows to call the clinic with any problems, questions or concerns.   Sullivan Lone MD MS AAHIVMS [facility] Hematology/Oncology Physician [facility]  .*Total Encounter Time as defined by the Centers for Medicare and Medicaid Services includes, in addition to the face-to-face time of a patient visit (documented in the note above) non-face-to-face time: obtaining and reviewing outside history, ordering and reviewing medications, tests or procedures, care coordination (communications with other health care professionals or caregivers) and documentation in the medical record.   I,Mitra Faeizi,acting as a Education administrator for Sullivan Lone, MD.,have documented all relevant documentation on the behalf of Sullivan Lone, MD,as directed by  Sullivan Lone, MD while in the presence of Sullivan Lone, MD.  .I have reviewed the above documentation for accuracy and completeness, and I agree with the above. Brunetta Genera MD

## 2022-03-25 ENCOUNTER — Telehealth: Payer: Self-pay | Admitting: Hematology

## 2022-03-25 NOTE — Telephone Encounter (Signed)
Called patient per 2/29 los notes to schedule f/u. Patient scheduled and notified.

## 2022-03-30 LAB — JAK2 (INCLUDING V617F AND EXON 12), MPL,& CALR W/RFL MPN PANEL (NGS)

## 2022-03-30 LAB — BCR ABL1 FISH (GENPATH)

## 2022-04-05 ENCOUNTER — Inpatient Hospital Stay: Payer: BC Managed Care – PPO | Attending: Hematology | Admitting: Hematology

## 2022-04-05 DIAGNOSIS — Z87891 Personal history of nicotine dependence: Secondary | ICD-10-CM | POA: Insufficient documentation

## 2022-04-05 DIAGNOSIS — Z8249 Family history of ischemic heart disease and other diseases of the circulatory system: Secondary | ICD-10-CM | POA: Insufficient documentation

## 2022-04-05 DIAGNOSIS — Z3A20 20 weeks gestation of pregnancy: Secondary | ICD-10-CM | POA: Diagnosis present

## 2022-04-05 DIAGNOSIS — Z79899 Other long term (current) drug therapy: Secondary | ICD-10-CM | POA: Insufficient documentation

## 2022-04-05 DIAGNOSIS — O99112 Other diseases of the blood and blood-forming organs and certain disorders involving the immune mechanism complicating pregnancy, second trimester: Secondary | ICD-10-CM | POA: Diagnosis present

## 2022-04-05 DIAGNOSIS — D72829 Elevated white blood cell count, unspecified: Secondary | ICD-10-CM | POA: Diagnosis not present

## 2022-04-05 NOTE — Progress Notes (Signed)
HEMATOLOGY/ONCOLOGY CLINIC NOTE  Date of Service: 04/05/22   Patient Care Team: Ronnell Freshwater, NP as PCP - General (Family Medicine) Brunetta Genera, MD as Consulting Physician (Hematology)  CHIEF COMPLAINTS/PURPOSE OF CONSULTATION:  Evaluation and management of leukocytosis  HISTORY OF PRESENTING ILLNESS:   Stephanie Schroeder is a wonderful 42 y.o. female who has been referred to [place] by Dr. Carlynn Purl for evaluation and management of leucocytosis in pregnancy.  Today, she confirms that she is [redacted] weeks pregnant. Her last CBC from 03/16/2022 revealed WBC of 18.7K, neutrophil 13.9, hemoglobin of 12.1, no anemia, and normal platelet level of 370K. She confirms that she previously had elevated WBC in the past.  She reports that during her COVID-19 infection in December 2023, she took Tylenol and Paxlovid. She describes that her infection was short-lived and okay overall. She denies any lingering cough and she reports that she was never treated for a bacterial infection. She has not previously ben on any steroids.   She reports that her congestion related to her allergies is stable and she manages it with Zyrtec. Her seasonal allergies began when she was in her 34s.  She does report that she experienced a tooth ache from a broken tooth in her bottom right mouth last night, but her pain has since resolved. She plans on going to the dentist soon. She is not aware of grinding her teeth at night.  She denies any new bone pain, other sinus infection, discomfort passing urine, persistent cough, or current tooth pain. She reports no concern for any ingrown hairs or any vaginal bacterial symptoms. She denies any unexplained fevers, chills, or new lumps/bumps.  She does note that she may feel some mild enlarged lymph nodes upon palpation. She reports that she previously had a cholecystectomy in 2022.  INTERVAL HISTORY: .I connected with Sukari Doerflinger on 04/05/2022 at  8:40 AM EDT by  telephone visit and verified that I am speaking with the correct person using two identifiers.   Patient was initially seen by me on 03/24/2022.   Patient notes she has been doing well overall without any new medical concerns since our last visit. She notes that after our last visit, she started developing urine discomfort. She went to her PCP who diagnosed her with an UTI and prescribed her antibiotics for a week.   She notes that her UTI symptoms have resolved after taking antibiotics.   We discussed lab results in detail.   I discussed the limitations, risks, security and privacy concerns of performing an evaluation and management service by telemedicine and the availability of in-person appointments. I also discussed with the patient that there may be a patient responsible charge related to this service. The patient expressed understanding and agreed to proceed.   Other persons participating in the visit and their role in the encounter: None   Patient's location: Home  Provider's location: [facility]   Chief Complaint: leukocytosis     MEDICAL HISTORY:  Past Medical History:  Diagnosis Date   Bipolar 1 disorder ([facility])    Depression    Gallstones    ROM (rupture of membranes), premature 07/17/2013   SVD (spontaneous vaginal delivery) 07/17/2013    SURGICAL HISTORY: Past Surgical History:  Procedure Laterality Date   CHOLECYSTECTOMY N/A 01/05/2021   Procedure: LAPAROSCOPIC CHOLECYSTECTOMY WITH INTRAOPERATIVE CHOLANGIOGRAM;  Surgeon: Erroll Luna, MD;  Location: [facility];  Service: General;  Laterality: N/A;   UPPER GI ENDOSCOPY      SOCIAL HISTORY: Social  History   Socioeconomic History   Marital status: Single    Spouse name: Not on file   Number of children: 1   Years of education: Not on file   Highest education level: Bachelor's degree (e.g., BA, AB, BS)  Occupational History   Not on file  Tobacco Use   Smoking status: Former    Packs/day: 0.50    Types:  Cigarettes    Quit date: 07/17/2017    Years since quitting: 4.7   Smokeless tobacco: Former  Scientific laboratory technician Use: Never used  Substance and Sexual Activity   Alcohol use: No   Drug use: No    Comment: History of cannibus use   Sexual activity: Yes  Other Topics Concern   Not on file  Social History Narrative   Not on file   Social Determinants of Health   Financial Resource Strain: Not on file  Food Insecurity: No Food Insecurity (03/24/2022)   Hunger Vital Sign    Worried About Running Out of Food in the Last Year: Never true    Ran Out of Food in the Last Year: Never true  Transportation Needs: No Transportation Needs (03/24/2022)   PRAPARE - Hydrologist (Medical): No    Lack of Transportation (Non-Medical): No  Physical Activity: Not on file  Stress: Not on file  Social Connections: Not on file  Intimate Partner Violence: Not At Risk (03/24/2022)   Humiliation, Afraid, Rape, and Kick questionnaire    Fear of Current or Ex-Partner: No    Emotionally Abused: No    Physically Abused: No    Sexually Abused: No    FAMILY HISTORY: Family History  Problem Relation Age of Onset   Hypertension Mother    Alcohol abuse Neg Hx    Arthritis Neg Hx    Asthma Neg Hx    Birth defects Neg Hx    Cancer Neg Hx    COPD Neg Hx    Depression Neg Hx    Diabetes Neg Hx    Drug abuse Neg Hx    Early death Neg Hx    Hearing loss Neg Hx    Heart disease Neg Hx    Hyperlipidemia Neg Hx    Kidney disease Neg Hx    Learning disabilities Neg Hx    Mental illness Neg Hx    Mental retardation Neg Hx    Miscarriages / Stillbirths Neg Hx    Stroke Neg Hx    Vision loss Neg Hx    Varicose Veins Neg Hx     ALLERGIES:  is allergic to risperidone and related and hydrocodone.  MEDICATIONS:  Current Outpatient Medications  Medication Sig Dispense Refill   azithromycin (ZITHROMAX) 250 MG tablet z-pack - take as directed for 5 days (Patient not taking:  Reported on 03/24/2022) 6 tablet 0   cetirizine (ZYRTEC) 10 MG tablet Take 10 mg by mouth daily as needed for allergies.     escitalopram (LEXAPRO) 20 MG tablet Take 2 tablets (40 mg total) by mouth daily. (Patient not taking: Reported on [id number])     folic acid (FOLVITE) 1 MG tablet Take 5 mg by mouth daily.     Multiple Vitamins-Minerals (MULTIVITAMIN GUMMIES WOMENS PO) Take 2 tablets by mouth daily.     No current facility-administered medications for this visit.    REVIEW OF SYSTEMS:    10 Point review of Systems was done is negative except as noted above.  PHYSICAL  EXAMINATION: Tele-med visit.   LABORATORY DATA:  I have reviewed the data as listed  .    Latest Ref Rng & Units 03/24/2022   10:06 AM 01/04/2021   11:27 PM 01/04/2021    1:28 PM  CBC  WBC 4.0 - 10.5 K/uL 20.4  11.2  12.1   Hemoglobin 12.0 - 15.0 g/dL 12.1  14.1  14.9   Hematocrit 36.0 - 46.0 % 36.2  43.1  44.9   Platelets 150 - 400 K/uL 344  363  373    .CBC    Component Value Date/Time   WBC 20.4 (H) 03/24/2022 1006   WBC 11.2 (H) 01/04/2021 2327   RBC 3.80 (L) 03/24/2022 1006   HGB 12.1 03/24/2022 1006   HGB 14.0 01/15/2019 0839   HGB 12.9 08/19/2013 1540   HCT 36.2 03/24/2022 1006   HCT 41.8 01/15/2019 0839   HCT 39.9 08/19/2013 1540   PLT 344 03/24/2022 1006   PLT 393 01/15/2019 0839   MCV 95.3 03/24/2022 1006   MCV 93 01/15/2019 0839   MCV 91.1 08/19/2013 1540   MCH 31.8 03/24/2022 1006   MCHC 33.4 03/24/2022 1006   RDW 13.6 03/24/2022 1006   RDW 12.5 01/15/2019 0839   RDW 15.6 (H) 08/19/2013 1540   LYMPHSABS 2.5 03/24/2022 1006   LYMPHSABS 2.2 01/15/2019 0839   LYMPHSABS 2.8 08/19/2013 1540   MONOABS 1.4 (H) 03/24/2022 1006   MONOABS 0.7 08/19/2013 1540   EOSABS 0.2 03/24/2022 1006   EOSABS 0.2 01/15/2019 0839   BASOSABS 0.1 03/24/2022 1006   BASOSABS 0.1 01/15/2019 0839   BASOSABS 0.1 08/19/2013 1540    .    Latest Ref Rng & Units 03/24/2022   10:06 AM 01/04/2021   11:27 PM  01/04/2021    1:28 PM  CMP  Glucose 70 - 99 mg/dL 74   91   BUN 6 - 20 mg/dL 5   10   Creatinine 0.44 - 1.00 mg/dL 0.37  0.65  0.59   Sodium 135 - 145 mmol/L 138   139   Potassium 3.5 - 5.1 mmol/L 3.8   4.5   Chloride 98 - 111 mmol/L 107   107   CO2 22 - 32 mmol/L 26   26   Calcium 8.9 - 10.3 mg/dL 8.5   9.4   Total Protein 6.5 - 8.1 g/dL 5.8   7.0   Total Bilirubin 0.3 - 1.2 mg/dL 0.2   0.3   Alkaline Phos 38 - 126 U/L 60   50   AST 15 - 41 U/L 13   12   ALT 0 - 44 U/L 19   12    CBC 03/18/22:      RADIOGRAPHIC STUDIES: I have personally reviewed the radiological images as listed and agreed with the findings in the report. No results found.   ASSESSMENT & PLAN:   Wonderful 42 y/o female wiho is at [redacted] weeks gestation with h/o previous pregnancy related leucocytosis.neutrophilia which resolved after that pregnancy now with   1) Leucocytosis -- predominantly neutrophilia + mild monocytosis. Likely pregnancy related. No overt symptoms suggestive of infection. No steroid use  PLAN: -Discussed lab results from 03/24/2022 with the patient. CBC shows elevated WBC at 20.4. CMP shows decreased creatinine level at 0.37, slightly decreased calcium level at 8.5 and decreased total protein level at 5.8. Ferritin levels were at 42. Vitamin-B12 level was 203.  -Discussed the JAK-2 mutation result and BCR/ABL FISH results from 03/24/2022. Negative for JAK-2  mutation. FISH results does not show BCR/ABL mutation.  -Discussed that elevated WBC is most likely due to UTI and pregnancy. -Recommended to start Ferrous Sulfate 1 tablet 3 times a week.  -Recommended to start Vitamin B-12 supplement 500 mcg daily.   FOLLOW-UP: Labs with OB/GYN in a week (patient recently treated with UTI) Phone visit with Dr. Irene Limbo in 10 weeks.  Labs 1-2 days prior to phone visit.  The total time spent in the appointment was 20 minutes* .  All of the patient's questions were answered with apparent satisfaction.  The patient knows to call the clinic with any problems, questions or concerns.   Sullivan Lone MD MS AAHIVMS [facility] Hematology/Oncology Physician [facility]  .*Total Encounter Time as defined by the Centers for Medicare and Medicaid Services includes, in addition to the face-to-face time of a patient visit (documented in the note above) non-face-to-face time: obtaining and reviewing outside history, ordering and reviewing medications, tests or procedures, care coordination (communications with other health care professionals or caregivers) and documentation in the medical record.   I, Cleda Mccreedy, am acting as a Education administrator for Sullivan Lone, MD. .I have reviewed the above documentation for accuracy and completeness, and I agree with the above. Brunetta Genera MD

## 2022-05-13 LAB — OB RESULTS CONSOLE RPR: RPR: NONREACTIVE

## 2022-05-13 LAB — OB RESULTS CONSOLE HIV ANTIBODY (ROUTINE TESTING): HIV: NONREACTIVE

## 2022-06-13 ENCOUNTER — Inpatient Hospital Stay: Payer: BC Managed Care – PPO | Attending: Hematology

## 2022-06-13 ENCOUNTER — Other Ambulatory Visit: Payer: Self-pay

## 2022-06-13 DIAGNOSIS — D72829 Elevated white blood cell count, unspecified: Secondary | ICD-10-CM

## 2022-06-13 DIAGNOSIS — O99112 Other diseases of the blood and blood-forming organs and certain disorders involving the immune mechanism complicating pregnancy, second trimester: Secondary | ICD-10-CM | POA: Diagnosis present

## 2022-06-13 DIAGNOSIS — Z3A20 20 weeks gestation of pregnancy: Secondary | ICD-10-CM | POA: Insufficient documentation

## 2022-06-13 DIAGNOSIS — Z79899 Other long term (current) drug therapy: Secondary | ICD-10-CM | POA: Insufficient documentation

## 2022-06-13 LAB — CMP (CANCER CENTER ONLY)
ALT: 11 U/L (ref 0–44)
AST: 11 U/L — ABNORMAL LOW (ref 15–41)
Albumin: 3.2 g/dL — ABNORMAL LOW (ref 3.5–5.0)
Alkaline Phosphatase: 75 U/L (ref 38–126)
Anion gap: 8 (ref 5–15)
BUN: 7 mg/dL (ref 6–20)
CO2: 22 mmol/L (ref 22–32)
Calcium: 9 mg/dL (ref 8.9–10.3)
Chloride: 107 mmol/L (ref 98–111)
Creatinine: 0.53 mg/dL (ref 0.44–1.00)
GFR, Estimated: >60 mL/min (ref >60)
Glucose, Bld: 110 mg/dL — ABNORMAL HIGH (ref 70–99)
Potassium: 3.4 mmol/L — ABNORMAL LOW (ref 3.5–5.1)
Sodium: 137 mmol/L (ref 135–145)
Total Bilirubin: 0.3 mg/dL (ref 0.3–1.2)
Total Protein: 5.8 g/dL — ABNORMAL LOW (ref 6.5–8.1)

## 2022-06-13 LAB — CBC WITH DIFFERENTIAL (CANCER CENTER ONLY)
Abs Immature Granulocytes: 0.39 10*3/uL — ABNORMAL HIGH (ref 0.00–0.07)
Basophils Absolute: 0.1 10*3/uL (ref 0.0–0.1)
Basophils Relative: 0 %
Eosinophils Absolute: 0.2 10*3/uL (ref 0.0–0.5)
Eosinophils Relative: 1 %
HCT: 33.4 % — ABNORMAL LOW (ref 36.0–46.0)
Hemoglobin: 11.5 g/dL — ABNORMAL LOW (ref 12.0–15.0)
Immature Granulocytes: 2 %
Lymphocytes Relative: 14 %
Lymphs Abs: 2.3 10*3/uL (ref 0.7–4.0)
MCH: 31.7 pg (ref 26.0–34.0)
MCHC: 34.4 g/dL (ref 30.0–36.0)
MCV: 92 fL (ref 80.0–100.0)
Monocytes Absolute: 1.2 10*3/uL — ABNORMAL HIGH (ref 0.1–1.0)
Monocytes Relative: 7 %
Neutro Abs: 12.8 10*3/uL — ABNORMAL HIGH (ref 1.7–7.7)
Neutrophils Relative %: 76 %
Platelet Count: 296 10*3/uL (ref 150–400)
RBC: 3.63 MIL/uL — ABNORMAL LOW (ref 3.87–5.11)
RDW: 13.3 % (ref 11.5–15.5)
WBC Count: 17 10*3/uL — ABNORMAL HIGH (ref 4.0–10.5)
nRBC: 0 % (ref 0.0–0.2)

## 2022-06-13 LAB — FERRITIN: Ferritin: 21 ng/mL (ref 11–307)

## 2022-06-13 LAB — VITAMIN B12: Vitamin B-12: 259 pg/mL (ref 180–914)

## 2022-06-15 ENCOUNTER — Inpatient Hospital Stay: Payer: BC Managed Care – PPO | Admitting: Hematology

## 2022-06-15 NOTE — Progress Notes (Signed)
Patient not contactable by phone --tried on several days. Msg RN to call with resultsThis encounter was created in error - please disregard.

## 2022-07-02 ENCOUNTER — Inpatient Hospital Stay (HOSPITAL_COMMUNITY)
Admission: AD | Admit: 2022-07-02 | Discharge: 2022-07-02 | Disposition: A | Discharge status: Home / Self Care | Payer: BC Managed Care – PPO | Attending: Obstetrics and Gynecology | Admitting: Obstetrics and Gynecology

## 2022-07-02 ENCOUNTER — Encounter (HOSPITAL_COMMUNITY): Payer: Self-pay

## 2022-07-02 DIAGNOSIS — O26893 Other specified pregnancy related conditions, third trimester: Secondary | ICD-10-CM | POA: Insufficient documentation

## 2022-07-02 DIAGNOSIS — O4703 False labor before 37 completed weeks of gestation, third trimester: Secondary | ICD-10-CM

## 2022-07-02 DIAGNOSIS — R030 Elevated blood-pressure reading, without diagnosis of hypertension: Secondary | ICD-10-CM | POA: Diagnosis not present

## 2022-07-02 DIAGNOSIS — Z3A34 34 weeks gestation of pregnancy: Secondary | ICD-10-CM | POA: Insufficient documentation

## 2022-07-02 LAB — CBC
HCT: 37.7 % (ref 36.0–46.0)
Hemoglobin: 12.4 g/dL (ref 12.0–15.0)
MCH: 31.2 pg (ref 26.0–34.0)
MCHC: 32.9 g/dL (ref 30.0–36.0)
MCV: 95 fL (ref 80.0–100.0)
Platelets: 312 10*3/uL (ref 150–400)
RBC: 3.97 MIL/uL (ref 3.87–5.11)
RDW: 13.9 % (ref 11.5–15.5)
WBC: 17.3 10*3/uL — ABNORMAL HIGH (ref 4.0–10.5)
nRBC: 0 % (ref 0.0–0.2)

## 2022-07-02 LAB — COMPREHENSIVE METABOLIC PANEL
ALT: 15 U/L (ref 0–44)
AST: 18 U/L (ref 15–41)
Albumin: 2.8 g/dL — ABNORMAL LOW (ref 3.5–5.0)
Alkaline Phosphatase: 85 U/L (ref 38–126)
Anion gap: 13 (ref 5–15)
BUN: <5 mg/dL — ABNORMAL LOW (ref 6–20)
CO2: 18 mmol/L — ABNORMAL LOW (ref 22–32)
Calcium: 9.2 mg/dL (ref 8.9–10.3)
Chloride: 105 mmol/L (ref 98–111)
Creatinine, Ser: 0.61 mg/dL (ref 0.44–1.00)
GFR, Estimated: >60 mL/min (ref >60)
Glucose, Bld: 67 mg/dL — ABNORMAL LOW (ref 70–99)
Potassium: 3.4 mmol/L — ABNORMAL LOW (ref 3.5–5.1)
Sodium: 136 mmol/L (ref 135–145)
Total Bilirubin: 0.6 mg/dL (ref 0.3–1.2)
Total Protein: 6.1 g/dL — ABNORMAL LOW (ref 6.5–8.1)

## 2022-07-02 LAB — URINALYSIS, ROUTINE W REFLEX MICROSCOPIC
Bilirubin Urine: NEGATIVE
Glucose, UA: NEGATIVE mg/dL
Hgb urine dipstick: NEGATIVE
Ketones, ur: NEGATIVE mg/dL
Leukocytes,Ua: NEGATIVE
Nitrite: NEGATIVE
Protein, ur: NEGATIVE mg/dL
Specific Gravity, Urine: 1.002 — ABNORMAL LOW (ref 1.005–1.030)
pH: 7 (ref 5.0–8.0)

## 2022-07-02 LAB — PROTEIN / CREATININE RATIO, URINE
Creatinine, Urine: 18 mg/dL
Total Protein, Urine: <6 mg/dL

## 2022-07-02 MED ORDER — LACTATED RINGERS IV BOLUS
1000.0000 mL | Freq: Once | INTRAVENOUS | Status: AC
  Administered 2022-07-02: 1000 mL via INTRAVENOUS

## 2022-07-02 MED ORDER — NIFEDIPINE 10 MG PO CAPS
10.0000 mg | ORAL_CAPSULE | Freq: Once | ORAL | Status: AC
  Administered 2022-07-02: 10 mg via ORAL
  Filled 2022-07-02: qty 1

## 2022-07-02 MED ORDER — BETAMETHASONE SOD PHOS & ACET 6 (3-3) MG/ML IJ SUSP
12.0000 mg | Freq: Once | INTRAMUSCULAR | Status: AC
  Administered 2022-07-02: 12 mg via INTRAMUSCULAR
  Filled 2022-07-02: qty 5

## 2022-07-02 NOTE — MAU Provider Note (Addendum)
History     CSN: [id number]  Arrival date and time: 07/02/22 1328   Event Date/Time   First Provider Initiated Contact with Patient 07/02/22 1446      Chief Complaint  Patient presents with   Contractions   HPI Ms. Stephanie Schroeder is a 42 y.o. year old G89P1001 female at [redacted]w[redacted]d weeks gestation who presents to MAU reporting contractions since 1030 this morning. She reports they were about every 5 mins on the way to the hospital. She rates the pain a 7/10. She reports she is a Runner, broadcasting/film/video at [employer] and "it was a pretty chill week since it was the last week of school." She was planning to go to the graduation ceremony today, but had to come here. She denies VB or LOF or abnormal vaginal d/c. She endorses (+) FM. Her pregnancy hx is significant for SVD @ 39 week (female weighing 5 lbs 14 oz) in 2015 with PPH. She is high risk for AMA and obesity with this pregnancy. She receives [facility] with [facility].   OB History     Gravida  2   Para  1   Term  1   Preterm      AB      Living  1      SAB      IAB      Ectopic      Multiple      Live Births  1           Past Medical History:  Diagnosis Date   Bipolar 1 disorder ([facility])    Depression    Gallstones    ROM (rupture of membranes), premature 07/17/2013   SVD (spontaneous vaginal delivery) 07/17/2013    Past Surgical History:  Procedure Laterality Date   CHOLECYSTECTOMY N/A 01/05/2021   Procedure: LAPAROSCOPIC CHOLECYSTECTOMY WITH INTRAOPERATIVE CHOLANGIOGRAM;  Surgeon: Harriette Bouillon, MD;  Location: [facility];  Service: General;  Laterality: N/A;   UPPER GI ENDOSCOPY      Family History  Problem Relation Age of Onset   Hypertension Mother    Alcohol abuse Neg Hx    Arthritis Neg Hx    Asthma Neg Hx    Birth defects Neg Hx    Cancer Neg Hx    COPD Neg Hx    Depression Neg Hx    Diabetes Neg Hx    Drug abuse Neg Hx    Early death Neg Hx    Hearing loss Neg Hx    Heart disease Neg Hx     Hyperlipidemia Neg Hx    Kidney disease Neg Hx    Learning disabilities Neg Hx    Mental illness Neg Hx    Mental retardation Neg Hx    Miscarriages / Stillbirths Neg Hx    Stroke Neg Hx    Vision loss Neg Hx    Varicose Veins Neg Hx     Social History   Tobacco Use   Smoking status: Former    Packs/day: .5    Types: Cigarettes    Quit date: 07/17/2017    Years since quitting: 4.9   Smokeless tobacco: Former  Building services engineer Use: Never used  Substance Use Topics   Alcohol use: No   Drug use: No    Comment: History of cannibus use    Allergies:  Allergies  Allergen Reactions   Risperidone And Related Swelling    Tongue swelling and throat over 30 years ago  Hydrocodone Nausea And Vomiting    Medications Prior to Admission  Medication Sig Dispense Refill Last Dose   calcium carbonate (OSCAL) 1500 (600 Ca) MG TABS tablet Take 600 mg of elemental calcium by mouth daily with breakfast.      cetirizine (ZYRTEC) 10 MG tablet Take 10 mg by mouth daily as needed for allergies.   07/01/2022   cyanocobalamin (VITAMIN B12) 100 MCG tablet Take 100 mcg by mouth daily.   07/01/2022   folic acid (FOLVITE) 1 MG tablet Take 5 mg by mouth daily.   07/01/2022   Multiple Vitamins-Minerals (MULTIVITAMIN GUMMIES WOMENS PO) Take 2 tablets by mouth daily.   07/01/2022   azithromycin (ZITHROMAX) 250 MG tablet z-pack - take as directed for 5 days (Patient not taking: Reported on 03/24/2022) 6 tablet 0    escitalopram (LEXAPRO) 20 MG tablet Take 2 tablets (40 mg total) by mouth daily. (Patient not taking: Reported on 03/24/2022)   Unknown    Review of Systems  Constitutional: Negative.   HENT: Negative.    Eyes: Negative.   Respiratory: Negative.    Cardiovascular:  Positive for leg swelling.  Gastrointestinal: Negative.   Endocrine: Negative.   Genitourinary:  Positive for pelvic pain (UC's since 1030 this AM).  Musculoskeletal: Negative.        Bilateral hand swelling  Skin: Negative.    Allergic/Immunologic: Negative.   Neurological: Negative.   Hematological: Negative.   Psychiatric/Behavioral: Negative.     Physical Exam  Patient Vitals for the past 24 hrs:  BP Temp Temp src Pulse Resp SpO2 Height Weight  07/02/22 1811 (!) 143/74 (!) 97.1 F (36.2 C) Oral (!) 126 17 98 % -- --  07/02/22 1801 136/75 -- -- 96 -- 98 % -- --  07/02/22 1746 121/86 -- -- 92 -- 98 % -- --  07/02/22 1731 138/77 -- -- (!) 101 -- 98 % -- --  07/02/22 1716 (!) 145/77 -- -- 94 -- 99 % -- --  07/02/22 1646 (!) 148/80 -- -- 89 -- 100 % -- --  07/02/22 1631 (!) 153/76 -- -- 84 -- 100 % -- --  07/02/22 1613 (!) 143/74 -- -- 85 -- 99 % -- --  07/02/22 1404 135/80 98.1 F (36.7 C) Oral 85 18 98 % -- --  07/02/22 1341 139/79 98.1 F (36.7 C) Oral 85 17 98 % 5\' 4"  (1.626 m) 90.3 kg     Physical Exam Vitals and nursing note reviewed. Exam conducted with a chaperone present.  Constitutional:      Appearance: Normal appearance. She is obese.  Cardiovascular:     Rate and Rhythm: Normal rate.  Pulmonary:     Effort: Pulmonary effort is normal.  Abdominal:     Palpations: Abdomen is soft.  Genitourinary:    General: Normal vulva.     Comments: Dilation: 1.5 Effacement (%): 70 Cervical Position: Posterior Station: -2 Presentation: Vertex Exam by:: Raelyn Mora, CNM  (unchanged x 3) Musculoskeletal:     Right lower leg: Edema present.     Left lower leg: Edema present.  Skin:    General: Skin is warm and dry.  Neurological:     Mental Status: She is alert and oriented to person, place, and time.  Psychiatric:        Mood and Affect: Mood normal.        Behavior: Behavior normal.        Thought Content: Thought content normal.  Judgment: Judgment normal.    Reassessment @ 1551: UC's not any different at 1 liter of LR Reassessment @ 1600: Notified of Dr. Meisinger's plan of care. Reassessment @ 1700: Feeling very hot and flushed, but UC's have not slowed down at all.  MAU  Course  Procedures  MDM CCUA Start IV BMZ 12 mg IM LR bolus 1000 ml x 2 liters Procardia 10 mg po x 1 dose CBC CMP P/C Ratio Serial BP's   *Consult with Dr. Jackelyn Knife @ [phone] - notified of patient's complaints, assessments, cervical checks, and NST results, recommended tx plan from Dr. Jackelyn Knife -- give 1 dose of Procardia 10 mg po, recheck cervix, if unchanged can d/c home   Results for orders placed or performed during the hospital encounter of 07/02/22 (from the past 24 hour(s))  Urinalysis, Routine w reflex microscopic -Urine, Clean Catch     Status: Abnormal   Collection Time: 07/02/22  1:53 PM  Result Value Ref Range   Color, Urine STRAW (A) YELLOW   APPearance CLEAR CLEAR   Specific Gravity, Urine 1.002 (L) 1.005 - 1.030   pH 7.0 5.0 - 8.0   Glucose, UA NEGATIVE NEGATIVE mg/dL   Hgb urine dipstick NEGATIVE NEGATIVE   Bilirubin Urine NEGATIVE NEGATIVE   Ketones, ur NEGATIVE NEGATIVE mg/dL   Protein, ur NEGATIVE NEGATIVE mg/dL   Nitrite NEGATIVE NEGATIVE   Leukocytes,Ua NEGATIVE NEGATIVE  Protein / creatinine ratio, urine     Status: None   Collection Time: 07/02/22  1:53 PM  Result Value Ref Range   Creatinine, Urine 18 mg/dL   Total Protein, Urine <6 mg/dL   Protein Creatinine Ratio BELOW REPORTABLE RANGE     0.00 - 0.15 mg/mg[Cre]  CBC     Status: Abnormal   Collection Time: 07/02/22  4:20 PM  Result Value Ref Range   WBC 17.3 (H) 4.0 - 10.5 K/uL   RBC 3.97 3.87 - 5.11 MIL/uL   Hemoglobin 12.4 12.0 - 15.0 g/dL   HCT 40.9 81.1 - 91.4 %   MCV 95.0 80.0 - 100.0 fL   MCH 31.2 26.0 - 34.0 pg   MCHC 32.9 30.0 - 36.0 g/dL   RDW 78.2 95.6 - 21.3 %   Platelets 312 150 - 400 K/uL   nRBC 0.0 0.0 - 0.2 %  Comprehensive metabolic panel     Status: Abnormal   Collection Time: 07/02/22  4:20 PM  Result Value Ref Range   Sodium 136 135 - 145 mmol/L   Potassium 3.4 (L) 3.5 - 5.1 mmol/L   Chloride 105 98 - 111 mmol/L   CO2 18 (L) 22 - 32 mmol/L   Glucose, Bld 67 (L) 70 -  99 mg/dL   BUN <5 (L) 6 - 20 mg/dL   Creatinine, Ser 0.86 0.44 - 1.00 mg/dL   Calcium 9.2 8.9 - 57.8 mg/dL   Total Protein 6.1 (L) 6.5 - 8.1 g/dL   Albumin 2.8 (L) 3.5 - 5.0 g/dL   AST 18 15 - 41 U/L   ALT 15 0 - 44 U/L   Alkaline Phosphatase 85 38 - 126 U/L   Total Bilirubin 0.6 0.3 - 1.2 mg/dL   GFR, Estimated >46 >96 mL/min   Anion gap 13 5 - 15     Assessment and Plan  1. Preterm uterine contractions in third trimester, antepartum - Information provided on PTL and preventing PTB   2. Elevated blood pressure reading in office without diagnosis of hypertension - Information provided on gHTN  and PEC - BP check with MAU visit tomorrow 07/03/2022   3. [redacted] weeks gestation of pregnancy   - Discharge patient - Return to MAU tomorrow for 2nd BMZ injection Order in sign and held orders) BP check at [facility] by Wednesday or Thursday of next week - Patient verbalized an understanding of the plan of care and agrees.   Raelyn Mora, CNM 07/02/2022, 3:02 PM

## 2022-07-02 NOTE — MAU Note (Signed)
...  Stephanie Schroeder is a 42 y.o. at [redacted]w[redacted]d here in MAU reporting: CTX since 1028 that are now every five minutes. Denies VB or LOF. Denies vaginal discharge. +FM.    Onset of complaint: 1028 Pain score: 7/10 lower abdomen  Vitals:   07/02/22 1341  Pulse: 85  Resp: 17  Temp: 98.1 F (36.7 C)  SpO2: 98%     FHT: 142 initial external Lab orders placed from triage:  UA

## 2022-07-02 NOTE — Discharge Instructions (Signed)
2/3-1-1 Rule Go to MAU for painful contractions every 2-3 minutes, lasting 1 minute each for 1.5 hours.  

## 2022-07-03 ENCOUNTER — Encounter (HOSPITAL_COMMUNITY): Payer: Self-pay | Admitting: Obstetrics and Gynecology

## 2022-07-03 ENCOUNTER — Other Ambulatory Visit: Payer: Self-pay

## 2022-07-03 ENCOUNTER — Inpatient Hospital Stay (HOSPITAL_COMMUNITY)
Admission: AD | Admit: 2022-07-03 | Discharge: 2022-07-03 | Disposition: A | Discharge status: Home / Self Care | Payer: BC Managed Care – PPO | Attending: Obstetrics and Gynecology | Admitting: Obstetrics and Gynecology

## 2022-07-03 DIAGNOSIS — O26893 Other specified pregnancy related conditions, third trimester: Secondary | ICD-10-CM | POA: Insufficient documentation

## 2022-07-03 DIAGNOSIS — Z3A34 34 weeks gestation of pregnancy: Secondary | ICD-10-CM | POA: Insufficient documentation

## 2022-07-03 DIAGNOSIS — O26899 Other specified pregnancy related conditions, unspecified trimester: Secondary | ICD-10-CM

## 2022-07-03 DIAGNOSIS — R109 Unspecified abdominal pain: Secondary | ICD-10-CM | POA: Insufficient documentation

## 2022-07-03 MED ORDER — BETAMETHASONE SOD PHOS & ACET 6 (3-3) MG/ML IJ SUSP
12.0000 mg | Freq: Once | INTRAMUSCULAR | Status: AC
  Administered 2022-07-03: 12 mg via INTRAMUSCULAR

## 2022-07-03 NOTE — MAU Provider Note (Signed)
Event Date/Time   First Provider Initiated Contact with Patient 07/03/22 1345      S Ms. Stephanie Schroeder is a 42 y.o. G2P1001 patient who presents to MAU today for 2nd injection of BMZ with no additional complaints. She reports that she was up until late last night with contractions and a little while during the night. She reports "feeling so much better when she woke up this morning." She reports "only a small amount of lower abdominal cramping." (+) FM. Denies VB or LOF.  O BP 118/63 (BP Location: Right Arm)   Temp 98.2 F (36.8 C) (Oral)   Resp 18   Ht 5\' 4"  (1.626 m)   Wt 93 kg   SpO2 97%   BMI 35.21 kg/m   Physical Exam Constitutional:      Appearance: Normal appearance. She is obese.  Cardiovascular:     Rate and Rhythm: Normal rate.  Pulmonary:     Effort: Pulmonary effort is normal.  Abdominal:     Palpations: Abdomen is soft.  Genitourinary:    Comments: Not indicated Musculoskeletal:        General: Normal range of motion.     Right lower leg: Edema (no different than yesterday) present.     Left lower leg: Edema (no different than yesterday) present.  Skin:    General: Skin is warm and dry.  Neurological:     Mental Status: She is alert and oriented to person, place, and time.  Psychiatric:        Mood and Affect: Mood normal.        Behavior: Behavior normal.        Thought Content: Thought content normal.        Judgment: Judgment normal.    FHTs by doppler: 136 bpm  A Medical screening exam complete 1. Abdominal cramping affecting pregnancy  2. [redacted] weeks gestation of pregnancy   P Discharge from MAU in stable condition Advised to continue with follow-up BP check with [place] [facility] this week Warning signs for worsening condition that would warrant emergency follow-up discussed Patient may return to MAU as needed for pregnancy complaints  Raelyn Mora, CNM 07/03/2022 1:46 PM

## 2022-07-03 NOTE — MAU Note (Signed)
.  Stephanie Schroeder is a 42 y.o. at [redacted]w[redacted]d here in MAU reporting: she's here to receive secondary BMZ injection as well have BP check.  Denies VB or LOF.  Endorses +FM.  Reports mild abdominal cramps and pelvic pressure.  Denies having any ctxs, "nothing compared to yesterday." LMP: NA Onset of complaint: today Pain score: 4 Vitals:   07/03/22 1334  BP: 118/63  Resp: 18  Temp: 98.2 F (36.8 C)  SpO2: 97%     FHT: 136 bpm Lab orders placed from triage:   None

## 2022-07-14 LAB — OB RESULTS CONSOLE GBS: GBS: NEGATIVE

## 2022-08-03 ENCOUNTER — Other Ambulatory Visit: Payer: Self-pay | Admitting: Obstetrics and Gynecology

## 2022-08-03 DIAGNOSIS — Z3A39 39 weeks gestation of pregnancy: Secondary | ICD-10-CM

## 2022-08-04 ENCOUNTER — Encounter (HOSPITAL_COMMUNITY): Payer: Self-pay | Admitting: Obstetrics and Gynecology

## 2022-08-04 ENCOUNTER — Inpatient Hospital Stay (HOSPITAL_COMMUNITY): Payer: BC Managed Care – PPO

## 2022-08-04 ENCOUNTER — Inpatient Hospital Stay (HOSPITAL_COMMUNITY): Payer: BC Managed Care – PPO | Admitting: Anesthesiology

## 2022-08-04 ENCOUNTER — Inpatient Hospital Stay (HOSPITAL_COMMUNITY)
Admission: AD | Admit: 2022-08-04 | Discharge: 2022-08-07 | DRG: 806 | Disposition: A | Discharge status: Home / Self Care | Payer: BC Managed Care – PPO | Attending: Obstetrics and Gynecology | Admitting: Obstetrics and Gynecology

## 2022-08-04 ENCOUNTER — Inpatient Hospital Stay (HOSPITAL_COMMUNITY)
Admission: RE | Admit: 2022-08-04 | Payer: BC Managed Care – PPO | Source: Home / Self Care | Admitting: Obstetrics and Gynecology

## 2022-08-04 DIAGNOSIS — O99214 Obesity complicating childbirth: Secondary | ICD-10-CM | POA: Diagnosis present

## 2022-08-04 DIAGNOSIS — O1414 Severe pre-eclampsia complicating childbirth: Secondary | ICD-10-CM | POA: Diagnosis present

## 2022-08-04 DIAGNOSIS — O26893 Other specified pregnancy related conditions, third trimester: Secondary | ICD-10-CM | POA: Diagnosis present

## 2022-08-04 DIAGNOSIS — Z87891 Personal history of nicotine dependence: Secondary | ICD-10-CM | POA: Diagnosis not present

## 2022-08-04 DIAGNOSIS — Z3A39 39 weeks gestation of pregnancy: Secondary | ICD-10-CM

## 2022-08-04 LAB — CBC
HCT: 40 % (ref 36.0–46.0)
Hemoglobin: 13.4 g/dL (ref 12.0–15.0)
MCH: 31.5 pg (ref 26.0–34.0)
MCHC: 33.5 g/dL (ref 30.0–36.0)
MCV: 94.1 fL (ref 80.0–100.0)
Platelets: 288 10*3/uL (ref 150–400)
RBC: 4.25 MIL/uL (ref 3.87–5.11)
RDW: 14.9 % (ref 11.5–15.5)
WBC: 18.7 10*3/uL — ABNORMAL HIGH (ref 4.0–10.5)
nRBC: 0 % (ref 0.0–0.2)

## 2022-08-04 LAB — PROTEIN / CREATININE RATIO, URINE
Creatinine, Urine: 39 mg/dL
Creatinine, Urine: 49 mg/dL
Protein Creatinine Ratio: 2.64 mg/mg{Cre} — ABNORMAL HIGH (ref 0.00–0.15)
Protein Creatinine Ratio: 0.65 mg/mg{Cre} — ABNORMAL HIGH (ref 0.00–0.15)
Total Protein, Urine: 103 mg/dL
Total Protein, Urine: 32 mg/dL

## 2022-08-04 LAB — COMPREHENSIVE METABOLIC PANEL
ALT: 14 U/L (ref 0–44)
AST: 18 U/L (ref 15–41)
Albumin: 2.5 g/dL — ABNORMAL LOW (ref 3.5–5.0)
Alkaline Phosphatase: 114 U/L (ref 38–126)
Anion gap: 8 (ref 5–15)
BUN: 7 mg/dL (ref 6–20)
CO2: 17 mmol/L — ABNORMAL LOW (ref 22–32)
Calcium: 9 mg/dL (ref 8.9–10.3)
Chloride: 108 mmol/L (ref 98–111)
Creatinine, Ser: 0.73 mg/dL (ref 0.44–1.00)
GFR, Estimated: >60 mL/min (ref >60)
Glucose, Bld: 92 mg/dL (ref 70–99)
Potassium: 3.6 mmol/L (ref 3.5–5.1)
Sodium: 133 mmol/L — ABNORMAL LOW (ref 135–145)
Total Bilirubin: 0.4 mg/dL (ref 0.3–1.2)
Total Protein: 5.8 g/dL — ABNORMAL LOW (ref 6.5–8.1)

## 2022-08-04 LAB — RPR: RPR Ser Ql: NONREACTIVE

## 2022-08-04 LAB — TYPE AND SCREEN
ABO/RH(D): A POS
Antibody Screen: NEGATIVE

## 2022-08-04 MED ORDER — LACTATED RINGERS IV SOLN: 500.0000 mL | INTRAVENOUS | Status: DC | PRN

## 2022-08-04 MED ORDER — LABETALOL HCL 5 MG/ML IV SOLN: 40.0000 mg | INTRAVENOUS | Status: DC | PRN

## 2022-08-04 MED ORDER — TRANEXAMIC ACID-NACL 1000-0.7 MG/100ML-% IV SOLN
1000.0000 mg | INTRAVENOUS | Status: AC
  Administered 2022-08-04: 1000 mg via INTRAVENOUS

## 2022-08-04 MED ORDER — LABETALOL HCL 5 MG/ML IV SOLN: 80.0000 mg | INTRAVENOUS | Status: DC | PRN

## 2022-08-04 MED ORDER — EPHEDRINE 5 MG/ML INJ: 10.0000 mg | INTRAVENOUS | Status: DC | PRN

## 2022-08-04 MED ORDER — FENTANYL CITRATE (PF) 100 MCG/2ML IJ SOLN: 50.0000 ug | INTRAMUSCULAR | Status: DC | PRN

## 2022-08-04 MED ORDER — LABETALOL HCL 5 MG/ML IV SOLN: 20.0000 mg | INTRAVENOUS | Status: DC | PRN

## 2022-08-04 MED ORDER — BENZOCAINE-MENTHOL 20-0.5 % EX AERO: 1.0000 | INHALATION_SPRAY | CUTANEOUS | Status: DC | PRN

## 2022-08-04 MED ORDER — FENTANYL-BUPIVACAINE-NACL 0.5-0.125-0.9 MG/250ML-% EP SOLN
12.0000 mL/h | EPIDURAL | Status: DC | PRN
  Administered 2022-08-04: 12 mL/h via EPIDURAL
  Filled 2022-08-04: qty 250

## 2022-08-04 MED ORDER — LIDOCAINE HCL (PF) 1 % IJ SOLN
INTRAMUSCULAR | Status: DC | PRN
  Administered 2022-08-04 (×2): 5 mL via EPIDURAL

## 2022-08-04 MED ORDER — IBUPROFEN 600 MG PO TABS
600.0000 mg | ORAL_TABLET | Freq: Four times a day (QID) | ORAL | Status: DC
  Administered 2022-08-04 – 2022-08-07 (×12): 600 mg via ORAL
  Filled 2022-08-04 (×12): qty 1

## 2022-08-04 MED ORDER — ZOLPIDEM TARTRATE 5 MG PO TABS: 5.0000 mg | ORAL_TABLET | Freq: Every evening | ORAL | Status: DC | PRN

## 2022-08-04 MED ORDER — TETANUS-DIPHTH-ACELL PERTUSSIS 5-2.5-18.5 LF-MCG/0.5 IM SUSY: 0.5000 mL | PREFILLED_SYRINGE | Freq: Once | INTRAMUSCULAR | Status: DC

## 2022-08-04 MED ORDER — DIPHENHYDRAMINE HCL 50 MG/ML IJ SOLN: 12.5000 mg | INTRAMUSCULAR | Status: DC | PRN

## 2022-08-04 MED ORDER — BUPIVACAINE HCL (PF) 0.25 % IJ SOLN
INTRAMUSCULAR | Status: DC | PRN
  Administered 2022-08-04: 8 mL via EPIDURAL

## 2022-08-04 MED ORDER — DIPHENHYDRAMINE HCL 25 MG PO CAPS: 25.0000 mg | ORAL_CAPSULE | Freq: Four times a day (QID) | ORAL | Status: DC | PRN

## 2022-08-04 MED ORDER — MAGNESIUM SULFATE BOLUS VIA INFUSION
4.0000 g | Freq: Once | INTRAVENOUS | Status: AC
  Administered 2022-08-04: 4 g via INTRAVENOUS
  Filled 2022-08-04: qty 1000

## 2022-08-04 MED ORDER — OXYTOCIN-SODIUM CHLORIDE 30-0.9 UT/500ML-% IV SOLN
2.5000 [IU]/h | INTRAVENOUS | Status: DC
  Filled 2022-08-04: qty 500

## 2022-08-04 MED ORDER — SOD CITRATE-CITRIC ACID 500-334 MG/5ML PO SOLN: 30.0000 mL | ORAL | Status: DC | PRN

## 2022-08-04 MED ORDER — LIDOCAINE HCL (PF) 1 % IJ SOLN: 30.0000 mL | INTRAMUSCULAR | Status: DC | PRN

## 2022-08-04 MED ORDER — LABETALOL HCL 5 MG/ML IV SOLN
INTRAVENOUS | Status: AC
  Administered 2022-08-04: 20 mg via INTRAVENOUS
  Filled 2022-08-04: qty 4

## 2022-08-04 MED ORDER — OXYTOCIN-SODIUM CHLORIDE 30-0.9 UT/500ML-% IV SOLN: 1.0000 m[IU]/min | INTRAVENOUS | Status: DC

## 2022-08-04 MED ORDER — OXYTOCIN BOLUS FROM INFUSION
333.0000 mL | Freq: Once | INTRAVENOUS | Status: AC
  Administered 2022-08-04: 333 mL via INTRAVENOUS

## 2022-08-04 MED ORDER — LACTATED RINGERS IV SOLN: 500.0000 mL | Freq: Once | INTRAVENOUS | Status: DC

## 2022-08-04 MED ORDER — WITCH HAZEL-GLYCERIN EX PADS: 1.0000 | MEDICATED_PAD | CUTANEOUS | Status: DC | PRN

## 2022-08-04 MED ORDER — TERBUTALINE SULFATE 1 MG/ML IJ SOLN: 0.2500 mg | Freq: Once | INTRAMUSCULAR | Status: DC | PRN

## 2022-08-04 MED ORDER — OXYCODONE-ACETAMINOPHEN 5-325 MG PO TABS: 2.0000 | ORAL_TABLET | ORAL | Status: DC | PRN

## 2022-08-04 MED ORDER — METHYLERGONOVINE MALEATE 0.2 MG/ML IJ SOLN
INTRAMUSCULAR | Status: AC
  Filled 2022-08-04: qty 1

## 2022-08-04 MED ORDER — ACETAMINOPHEN 325 MG PO TABS: 650.0000 mg | ORAL_TABLET | ORAL | Status: DC | PRN

## 2022-08-04 MED ORDER — PRENATAL MULTIVITAMIN CH
1.0000 | ORAL_TABLET | Freq: Every day | ORAL | Status: DC
  Administered 2022-08-05 – 2022-08-07 (×3): 1 via ORAL
  Filled 2022-08-04 (×3): qty 1

## 2022-08-04 MED ORDER — LACTATED RINGERS IV SOLN: INTRAVENOUS | Status: DC

## 2022-08-04 MED ORDER — MAGNESIUM SULFATE 40 GM/1000ML IV SOLN
2.0000 g/h | INTRAVENOUS | Status: AC
  Administered 2022-08-05: 2 g/h via INTRAVENOUS
  Filled 2022-08-04 (×2): qty 1000

## 2022-08-04 MED ORDER — DIBUCAINE (PERIANAL) 1 % EX OINT: 1.0000 | TOPICAL_OINTMENT | CUTANEOUS | Status: DC | PRN

## 2022-08-04 MED ORDER — OXYTOCIN-SODIUM CHLORIDE 30-0.9 UT/500ML-% IV SOLN
1.0000 m[IU]/min | INTRAVENOUS | Status: DC
  Administered 2022-08-04: 2 m[IU]/min via INTRAVENOUS

## 2022-08-04 MED ORDER — TRANEXAMIC ACID-NACL 1000-0.7 MG/100ML-% IV SOLN
INTRAVENOUS | Status: AC
  Filled 2022-08-04: qty 100

## 2022-08-04 MED ORDER — COCONUT OIL OIL: 1.0000 | TOPICAL_OIL | Status: DC | PRN

## 2022-08-04 MED ORDER — SENNOSIDES-DOCUSATE SODIUM 8.6-50 MG PO TABS
2.0000 | ORAL_TABLET | Freq: Every day | ORAL | Status: DC
  Administered 2022-08-05 – 2022-08-07 (×3): 2 via ORAL
  Filled 2022-08-04 (×3): qty 2

## 2022-08-04 MED ORDER — PHENYLEPHRINE 80 MCG/ML (10ML) SYRINGE FOR IV PUSH (FOR BLOOD PRESSURE SUPPORT): 80.0000 ug | PREFILLED_SYRINGE | INTRAVENOUS | Status: DC | PRN

## 2022-08-04 MED ORDER — ONDANSETRON HCL 4 MG/2ML IJ SOLN: 4.0000 mg | INTRAMUSCULAR | Status: DC | PRN

## 2022-08-04 MED ORDER — SIMETHICONE 80 MG PO CHEW: 80.0000 mg | CHEWABLE_TABLET | ORAL | Status: DC | PRN

## 2022-08-04 MED ORDER — HYDRALAZINE HCL 20 MG/ML IJ SOLN: 10.0000 mg | INTRAMUSCULAR | Status: DC | PRN

## 2022-08-04 MED ORDER — OXYCODONE-ACETAMINOPHEN 5-325 MG PO TABS: 1.0000 | ORAL_TABLET | ORAL | Status: DC | PRN

## 2022-08-04 MED ORDER — ONDANSETRON HCL 4 MG PO TABS
4.0000 mg | ORAL_TABLET | ORAL | Status: DC | PRN
  Administered 2022-08-04: 4 mg via ORAL
  Filled 2022-08-04: qty 1

## 2022-08-04 MED ORDER — ACETAMINOPHEN 325 MG PO TABS
650.0000 mg | ORAL_TABLET | ORAL | Status: DC | PRN
  Administered 2022-08-05 – 2022-08-07 (×5): 650 mg via ORAL
  Filled 2022-08-04 (×5): qty 2

## 2022-08-04 MED ORDER — MAGNESIUM SULFATE 40 GM/1000ML IV SOLN
2.0000 g/h | INTRAVENOUS | Status: DC
  Filled 2022-08-04: qty 1000

## 2022-08-04 MED ORDER — ONDANSETRON HCL 4 MG/2ML IJ SOLN
4.0000 mg | Freq: Four times a day (QID) | INTRAMUSCULAR | Status: DC | PRN
  Administered 2022-08-04 (×2): 4 mg via INTRAVENOUS
  Filled 2022-08-04 (×2): qty 2

## 2022-08-04 MED ORDER — METHYLERGONOVINE MALEATE 0.2 MG/ML IJ SOLN
0.2000 mg | Freq: Once | INTRAMUSCULAR | Status: AC
  Administered 2022-08-04: 0.2 mg via INTRAMUSCULAR

## 2022-08-04 MED ORDER — CEFAZOLIN SODIUM-DEXTROSE 2-4 GM/100ML-% IV SOLN
2.0000 g | Freq: Once | INTRAVENOUS | Status: AC
  Administered 2022-08-04: 2 g via INTRAVENOUS
  Filled 2022-08-04: qty 100

## 2022-08-04 NOTE — MAU Note (Signed)
Stephanie Schroeder is a 42 y.o. at [redacted]w[redacted]d here in MAU reporting: CTX for an hour and half that are 2 minutes apart. Pt states she had her membranes swept yesterday and she was 4cm. Pt denies LOF. Pt states she has some spotting from the membrane sweep. Pt states she's an IOL in the morning. +FM  Onset of complaint: 2300 Pain score: 7/10 Vitals:   08/04/22 0040  BP: (!) 166/103  Pulse: 100  Resp: 18  Temp: 98.6 F (37 C)  SpO2: 99%     FHT:127 Lab orders placed from triage:  mau labor

## 2022-08-04 NOTE — Progress Notes (Signed)
Patient ID: Karey Suthers, female   DOB: 27-Mar-1980, 42 y.o.   MRN: [id number] This is a 42 y.o. female at [redacted]w[redacted]d who was here for a labor evaluation  She was noted to have Blood Pressure elevation with pressures in the severe range Denies headache or visual changes  Vitals:   08/04/22 0040 08/04/22 0100 08/04/22 0145 08/04/22 0212  BP: (!) 166/103 (!) 161/98 (!) 145/91 (!) 155/96  Pulse: 100 97 (!) 104   Resp: 18   20  Temp: 98.6 F (37 C)     TempSrc: Oral     SpO2: 99% 98%    Weight: 95.6 kg     Height: 5\' 4"  (1.626 m)      FHR stable and category I Baseline 130 with accels UCs q 2-3 min  Dilation: 4.5 Effacement (%): 70 Cervical Position: Middle Station: -2 Presentation: Undeterminable Exam by:: Henrine Screws, RN  Dr Tenny Craw notified  Will admit for IOL  Was scheduled for IOL tomorrow already  Will release admission orders Preeclampsia focused order set ordered BP improved with one dose of Labetalol 20mg  IV (see above)   Single IUP at [redacted]w[redacted]d Preeclampsia with severe features Latent labor Reactive NST  MD to follow

## 2022-08-04 NOTE — Anesthesia Preprocedure Evaluation (Signed)
Anesthesia Evaluation  Patient identified by MRN, date of birth, ID band Patient awake    Reviewed: Allergy & Precautions, Patient's Chart, lab work & pertinent test results  Airway Mallampati: II       Dental no notable dental hx.    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   Anesthesia Other Findings   Reproductive/Obstetrics (+) Pregnancy AMA                             Anesthesia Physical Anesthesia Plan  ASA: 2  Anesthesia Plan: Epidural   Post-op Pain Management:    Induction:   PONV Risk Score and Plan:   Airway Management Planned: Natural Airway  Additional Equipment:   Intra-op Plan:   Post-operative Plan:   Informed Consent: I have reviewed the patients History and Physical, chart, labs and discussed the procedure including the risks, benefits and alternatives for the proposed anesthesia with the patient or authorized representative who has indicated his/her understanding and acceptance.       Plan Discussed with: Anesthesiologist  Anesthesia Plan Comments:        Anesthesia Quick Evaluation

## 2022-08-04 NOTE — H&P (Signed)
Stephanie Schroeder is a 42 y.o. female who presented overnight for labor evaluation. Found to be 4-5cm with bloody show. Also noted to have sever range BP x2 (160s) that responded well to IV labetalol. PreE labs ordered and patient admitted to L&D. +FM, denies LOF  PNC c/b 1) AMA - low risk NIPT 2) depression not on medication 3) Leukocytosis - WBC havd been 17-20K throughout pregnancy, is being followed by Heme/Onc  GBS neg. Does desire BTL, amenable to both postpartum as well interval BTL. Low lying placenta resolved  OB History     Gravida  2   Para  1   Term  1   Preterm      AB      Living  1      SAB      IAB      Ectopic      Multiple      Live Births  1          Past Medical History:  Diagnosis Date   Bipolar 1 disorder (HCC)    Depression    Gallstones    ROM (rupture of membranes), premature 07/17/2013   SVD (spontaneous vaginal delivery) 07/17/2013   Past Surgical History:  Procedure Laterality Date   CHOLECYSTECTOMY N/A 01/05/2021   Procedure: LAPAROSCOPIC CHOLECYSTECTOMY WITH INTRAOPERATIVE CHOLANGIOGRAM;  Surgeon: Harriette Bouillon, MD;  Location: [facility];  Service: General;  Laterality: N/A;   UPPER GI ENDOSCOPY     Family History: family history includes Hypertension in her mother. Social History:  reports that she quit smoking about 5 years ago. Her smoking use included cigarettes. She has quit using smokeless tobacco. She reports that she does not drink alcohol and does not use drugs.     Maternal Diabetes: No1hr 36 Genetic Screening: Normal Maternal Ultrasounds/Referrals: Normal Fetal Ultrasounds or other Referrals:  None Maternal Substance Abuse:  No Significant Maternal Medications:  None Significant Maternal Lab Results:  Group B Strep negative Number of Prenatal Visits:greater than 3 verified prenatal visits Other Comments:  None  Review of Systems  Constitutional:  Negative for chills and fever.  Respiratory:  Negative for  shortness of breath.   Cardiovascular:  Negative for chest pain, palpitations and leg swelling.  Gastrointestinal:  Negative for abdominal pain, nausea and vomiting.  Neurological:  Negative for dizziness, weakness and headaches.  Psychiatric/Behavioral:  Negative for suicidal ideas.    Maternal Medical History:  Reason for admission: Nausea.    Dilation: 8 Effacement (%): 90 Station: -2 Exam by:: gillen, rn Blood pressure (!) 152/82, pulse 86, temperature 98.8 F (37.1 C), temperature source Oral, resp. rate 20, height 5\' 4"  (1.626 m), weight 95.6 kg, SpO2 98%, unknown if currently breastfeeding. Exam Physical Exam Constitutional:      General: She is not in acute distress.    Appearance: She is well-developed.  HENT:     Head: Normocephalic and atraumatic.  Eyes:     Pupils: Pupils are equal, round, and reactive to light.  Cardiovascular:     Rate and Rhythm: Normal rate and regular rhythm.     Heart sounds: No murmur heard.    No gallop.  Abdominal:     Tenderness: There is no abdominal tenderness. There is no guarding or rebound.  Genitourinary:    Vagina: Normal.     Uterus: Normal.   Musculoskeletal:        General: Normal range of motion.     Cervical back: Normal range of motion and neck  supple.  Skin:    General: Skin is warm and dry.  Neurological:     Mental Status: She is alert and oriented to person, place, and time.     Prenatal labs: ABO, Rh: --/--/A POS (07/11 0117) Antibody: NEG (07/11 0117) Rubella: Immune (12/20 0000) RPR: NON REACTIVE (07/11 0117)  HBsAg: Negative (12/20 0000)  HIV: Non-reactive (04/19 0000)  GBS: Negative/-- (06/20 0000)   uPC (after epidural) 0.65, Cr 0.73, AST/ALT 18/14, 13.4/40/288K, WBC 18.7K  Assessment/Plan: This is a 41yo G2P1001 @ 39 2/7 admitted in labor with newly diagnosed PreE. S/p epidural, PreE labs WNL except uPC 0.65. Asymptomatic from PreE standpoint. BP elevated but not yet severe after epidural. Patient  now s/p AROM, CE unchanged over 2 hrs, augmentation ordered, GBS neg. Plan on postpartum magnesium sulfate given need for IV meds initially plus increased risk of ppPreE in AMA/long-interval patient. Pelvis proven to 5lb14oz.  Does desires ppBTL if timing/OR allows. .Dicussed with patient R/B/A of postpartum bilateral tubal ligation. Alternatives includes internal ligation, LARCs. Discussed increased risk of ectopic pregnancy after procedure (1 in 3 pregnancies post-ligation can be ectopic) and that a pregnancy test should be taken if suspicion arises postpartum. Discussed risk of failure 1/100, backup method should be used for 2-4wks. Discussed R/B or surgical procedure including bleeding, infection, injury to surrounding organs. Patient agrees to blood products if needed. Discussed that BTL does not protect against STI and that barrier protection should be used. Patient understands and intends to comply.     Stephanie Schroeder 08/04/2022, 9:00 AM

## 2022-08-04 NOTE — Progress Notes (Addendum)
Magnesium sulfate initiated, BP still elevated. Feeling intermittent pressure, practice push shows anterior lip reduced, complete/0 station. Allowed to labor down 41m, will start pushing then BP 132/75   Pulse 92   Temp 98 F (36.7 C) (Oral)   Resp 16   Ht 5\' 4"  (1.626 m)   Wt 95.6 kg   SpO2 98%   BMI 36.17 kg/m  Cat 1 tracing with early decels, TOCO q2-68m, pitocin at 44mU/min

## 2022-08-04 NOTE — Progress Notes (Signed)
Saw and evaluated patient at bedside on OBSC. Tolerating PO without N/V, denies symptomatic anemia, minimal lochia. Feels urge to void, will ask for RN. Denies PreE symptoms, magnesium sulfate running.  Discussed ppBTL. Patient declined having immediate ppBTL as she felt fatigued, had PPH of 1038cc and wanted time to eat and bond with baby. Spoke with OB OR charge nurse. Epidural catheter in place. Patient added onto tomorrow's schedule at noon. NPO after 0200, patient aware that this is still elective procedure and may be bumped by urgent cases.   BP (!) 149/76 (BP Location: Right Arm)   Pulse (!) 107   Temp 98.2 F (36.8 C) (Oral)   Resp 18   Ht 5\' 4"  (1.626 m)   Wt 95.6 kg   SpO2 97%   Breastfeeding Unknown   BMI 36.17 kg/m

## 2022-08-04 NOTE — Lactation Note (Signed)
This note was copied from a baby's chart. Lactation Consultation Note  Patient Name: Stephanie Schroeder UEAVW'U Date: 08/04/2022 Age:42 hours  LC attempted to see mom the first time and mom was in BF. Then went back and mom was in bed on bed pan. LC left and went back when called after mom finished. Mom stated the baby hasn't been interested and is sleepy. LC set up DEBP d/t mom had PPH suggesting mom pumps for extra stimulation.mom agreed. Mom shown how to use DEBP & how to disassemble, clean, & reassemble parts. Mom pumped and collected 5 ml. Mom excited Mom encouraged to feed baby 8-12 times/24 hours and with feeding cues.  Newborn feeding habits, STS, I&O, positioning, support, props reviewed.  LC stimulated baby and woke her up, placed her at mom's breast. Demonstrated how to latch. Baby has a tendency to suckle on her bottom lip. LC finally got baby latched. Demonstrated touching top lip w/mom's nipple to get baby to open wide. Baby BF OK, needing stimulation and chin tug to keep open flange. After BF mom gave the BM that she pumped. Mom was happy to get the colostrum. Praised mom. Mom has some generalized edema.  Call for assistance or questions.  Mom's feeding plan: Mom is to pre-pump then latch baby to the breast Give any colostrum in bottle.   Maternal Data    Feeding    LATCH Score                    Lactation Tools Discussed/Used    Interventions    Discharge    Consult Status      Charyl Dancer 08/04/2022, 9:23 PM

## 2022-08-04 NOTE — Anesthesia Procedure Notes (Signed)
Epidural Patient location during procedure: OB Start time: 08/04/2022 2:58 AM End time: 08/04/2022 3:06 AM  Staffing Anesthesiologist: Mal Amabile, MD Performed: anesthesiologist   Preanesthetic Checklist Completed: patient identified, IV checked, site marked, risks and benefits discussed, surgical consent, monitors and equipment checked, pre-op evaluation and timeout performed  Epidural Patient position: sitting Prep: DuraPrep and site prepped and draped Patient monitoring: continuous pulse ox and blood pressure Approach: midline Location: L3-L4 Injection technique: LOR air  Needle:  Needle type: Tuohy  Needle gauge: 17 G Needle length: 9 cm and 9 Needle insertion depth: 6 cm Catheter type: closed end flexible Catheter size: 19 Gauge Catheter at skin depth: 11 cm Test dose: negative and Other  Assessment Events: blood not aspirated, no cerebrospinal fluid, injection not painful, no injection resistance, no paresthesia and negative IV test  Additional Notes Patient identified. Risks and benefits discussed including failed block, incomplete  Pain control, post dural puncture headache, nerve damage, paralysis, blood pressure Changes, nausea, vomiting, reactions to medications-both toxic and allergic and post Partum back pain. All questions were answered. Patient expressed understanding and wished to proceed. Sterile technique was used throughout procedure. Epidural site was Dressed with sterile barrier dressing. No paresthesias, signs of intravascular injection Or signs of intrathecal spread were encountered.  Patient was more comfortable after the epidural was dosed. Please see RN's note for documentation of vital signs and FHR which are stable.

## 2022-08-05 ENCOUNTER — Encounter (HOSPITAL_COMMUNITY)
Admission: AD | Disposition: A | Discharge status: Home / Self Care | Payer: Self-pay | Source: Home / Self Care | Attending: Obstetrics and Gynecology

## 2022-08-05 ENCOUNTER — Encounter (HOSPITAL_COMMUNITY): Payer: Self-pay | Admitting: Anesthesiology

## 2022-08-05 ENCOUNTER — Other Ambulatory Visit: Payer: Self-pay

## 2022-08-05 LAB — CBC
HCT: 31.5 % — ABNORMAL LOW (ref 36.0–46.0)
Hemoglobin: 10.6 g/dL — ABNORMAL LOW (ref 12.0–15.0)
MCH: 32.5 pg (ref 26.0–34.0)
MCHC: 33.7 g/dL (ref 30.0–36.0)
MCV: 96.6 fL (ref 80.0–100.0)
Platelets: 253 10*3/uL (ref 150–400)
RBC: 3.26 MIL/uL — ABNORMAL LOW (ref 3.87–5.11)
RDW: 15.2 % (ref 11.5–15.5)
WBC: 28.3 10*3/uL — ABNORMAL HIGH (ref 4.0–10.5)
nRBC: 0 % (ref 0.0–0.2)

## 2022-08-05 SURGERY — LIGATION, FALLOPIAN TUBE, POSTPARTUM
Anesthesia: Choice | Laterality: Bilateral

## 2022-08-05 MED ORDER — NIFEDIPINE ER OSMOTIC RELEASE 30 MG PO TB24
30.0000 mg | ORAL_TABLET | Freq: Every day | ORAL | Status: DC
  Administered 2022-08-05 – 2022-08-07 (×3): 30 mg via ORAL
  Filled 2022-08-05 (×3): qty 1

## 2022-08-05 NOTE — Anesthesia Postprocedure Evaluation (Signed)
Anesthesia Post Note  Patient: Stephanie Schroeder  Procedure(s) Performed: AN AD HOC LABOR EPIDURAL     Patient location during evaluation: Mother Baby Anesthesia Type: Epidural Level of consciousness: awake and alert and oriented Pain management: satisfactory to patient Vital Signs Assessment: post-procedure vital signs reviewed and stable Respiratory status: respiratory function stable Cardiovascular status: stable Postop Assessment: no headache, no backache, epidural receding, patient able to bend at knees, no signs of nausea or vomiting, adequate PO intake and able to ambulate Anesthetic complications: no Comments: PPTL cancelled. Epidural catheter removed, tip inract.    No notable events documented.  Last Vitals:  Vitals:   08/05/22 0659 08/05/22 0834  BP:  (!) 144/88  Pulse:  89  Resp: 16 16  Temp:  36.5 C  SpO2:  98%    Last Pain:  Vitals:   08/05/22 0834  TempSrc: Oral  PainSc:    Pain Goal: Patients Stated Pain Goal: 3 (08/04/22 1900)                 Karleen Dolphin

## 2022-08-05 NOTE — Anesthesia Postprocedure Evaluation (Signed)
Anesthesia Post Note  Patient: Stephanie Schroeder  Procedure(s) Performed: AN AD HOC LABOR EPIDURAL     Patient location during evaluation: Mother Baby Anesthesia Type: Epidural Level of consciousness: awake and alert Pain management: pain level controlled Vital Signs Assessment: post-procedure vital signs reviewed and stable Respiratory status: spontaneous breathing, nonlabored ventilation and respiratory function stable Cardiovascular status: stable Postop Assessment: no headache, no backache and epidural receding Anesthetic complications: no  No notable events documented.  Last Vitals:  Vitals:   08/05/22 0600 08/05/22 0659  BP:    Pulse:    Resp: 16 16  Temp:    SpO2:      Last Pain:  Vitals:   08/05/22 0325  TempSrc: Oral  PainSc:                  Cordarius Benning

## 2022-08-05 NOTE — Addendum Note (Signed)
Addendum  created 08/05/22 1028 by Graciela Husbands, CRNA   Clinical Note Signed

## 2022-08-05 NOTE — Progress Notes (Signed)
PPD #1 No problems, doesn't feel great on magnesium Afeb, VSS, BP 130-140/70-80 Fundus firm, NT at U-1 Continue routine postpartum care, continue magnesium for 24 hrs.  Discussed unable to do PPBTL since has preeclampsia and on magnesium.  Will monitor BP and treat prn

## 2022-08-05 NOTE — Anesthesia Preprocedure Evaluation (Signed)
Anesthesia Evaluation  Patient identified by MRN, date of birth, ID band Patient awake    Reviewed: Allergy & Precautions, NPO status , Patient's Chart, lab work & pertinent test results  Airway Mallampati: II  TM Distance: >3 FB     Dental no notable dental hx. 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   Anesthesia Other Findings   Reproductive/Obstetrics Desires sterilization                              Anesthesia Physical Anesthesia Plan  ASA: 2  Anesthesia Plan: Epidural   Post-op Pain Management: Minimal or no pain anticipated   Induction: Intravenous  PONV Risk Score and Plan: 4 or greater and Treatment may vary due to age or medical condition  Airway Management Planned: Natural Airway  Additional Equipment: None  Intra-op Plan:   Post-operative Plan:   Informed Consent: I have reviewed the patients History and Physical, chart, labs and discussed the procedure including the risks, benefits and alternatives for the proposed anesthesia with the patient or authorized representative who has indicated his/her understanding and acceptance.     Dental advisory given  Plan Discussed with: Anesthesiologist and CRNA  Anesthesia Plan Comments:         Anesthesia Quick Evaluation

## 2022-08-06 NOTE — Progress Notes (Signed)
Received referral to assist with DME (RW, 3-in-1 BSC). Contacted [facility] for referral. They accepted the referral. DME progress note needs to be co-sign.

## 2022-08-06 NOTE — Lactation Note (Addendum)
This note was copied from a baby's chart. Lactation Consultation Note  Patient Name: Girl Merrilee Herms UEAVW'U Date: 08/06/2022 Age:42 hours Reason for consult: Follow-up assessment;Mother's request;Term (Weight loss -4.28%). See Birth Parent's -MR- On IV Mag, with hx of pre-eclampsia with recent pregnancy.  P2, term female infant, Per Birth Parent wanted latch assistance having few painful latches did not know how to break infant's latch and infant's top lip was inward instead of flanged outward. LC worked with Careers information officer on positing and providing pillow support with latch, LC demonstrated how to break infant's latch if Birth Parent was feeling pain or discomfort. How to extend infant's lower jaw bring infant chin first to breast, few attempts made and Per Birth Parent, the positioning and latch felt much better not having any discomfort with latch. Birth Parent latched infant on her left breast using the cradle hold position and infant was still breastfeeding after 12 minutes when LC left the room. LC discussed infant's input and output, Per Birth Parent, infant had 5 stools but unsure about voids due Support Person changing infant's diaper. LC reviewed importance of maternal diet, rest and hydration. Birth Parent knows to call RN/LC if she had further questions, concerns or need additional latch assistance.  Birth Parent had DEBP at home.  LC discussed discharge education: engorgement treatment and prevention, warning signs of dehydration in infant, how to know if breastfeeding is going well and infant's input and output within the 1st week of life. LC gave handout " Breastfeeding Consultation Services"- LC Hotline, [facility] and [facility] Postpartum Support Group.  Maternal Data    Feeding Mother's Current Feeding Choice: Breast Milk  LATCH Score Latch: Grasps breast easily, tongue down, lips flanged, rhythmical sucking.  Audible Swallowing: Spontaneous and intermittent  Type of  Nipple: Everted at rest and after stimulation  Comfort (Breast/Nipple): Soft / non-tender  Hold (Positioning): Assistance needed to correctly position infant at breast and maintain latch.  LATCH Score: 9   Lactation Tools Discussed/Used    Interventions Interventions: Position options;Support pillows;Education;Skin to skin;Assisted with latch;Adjust position;Breast compression  Discharge Discharge Education: Engorgement and breast care;Warning signs for feeding baby  Consult Status Consult Status: Follow-up Date: 08/07/22 Follow-up type: In-patient    Frederico Hamman 08/06/2022, 4:16 PM

## 2022-08-06 NOTE — Plan of Care (Signed)
  Problem: Coping: Goal: Level of anxiety will decrease Outcome: Progressing   Problem: Elimination: Goal: Will not experience complications related to bowel motility Outcome: Progressing   Problem: Pain Managment: Goal: General experience of comfort will improve Outcome: Progressing   Problem: Safety: Goal: Ability to remain free from injury will improve Outcome: Progressing   

## 2022-08-06 NOTE — Progress Notes (Signed)
    Durable Medical Equipment  (From admission, onward)           Start     Ordered   08/06/22 1307  For home use only DME Walker rolling  Once       Question Answer Comment  Walker: With 5 Inch Wheels   Patient needs a walker to treat with the following condition Mobility impaired      08/06/22 1306   08/06/22 1306  For home use only DME Bedside commode  Once       Comments: 3-in-1 bedside commode for at home use Per PT recommendation  Question:  Patient needs a bedside commode to treat with the following condition  Answer:  Mobility impaired   08/06/22 1306

## 2022-08-06 NOTE — Evaluation (Signed)
Physical Therapy Evaluation Patient Details Name: Stephanie Schroeder MRN: [id number] DOB: 1980/04/14 Today's Date: 08/06/2022  History of Present Illness  Stephanie Schroeder is a 42 y.o. female who presented  for L and D.  Also for tubal ligation.  PMH:  Bipolar, depression, anxiety, cholecystectomy  Clinical Impression  Pt admitted with above diagnosis. Pt was able to ambulate with RW with min guard assist and cues for sequencing steps and RW intiially and progressing to not needing cues. Pt slightly weaker intiially moving left LE but by end of walk, advancing left LE with more ease.  Pt should progress well.  Updated nurse and MD.   Pt currently with functional limitations due to the deficits listed below (see PT Problem List). Pt will benefit from acute skilled PT to increase their independence and safety with mobility to allow discharge.           Assistance Recommended at Discharge PRN  If plan is discharge home, recommend the following:  Can travel by private vehicle  Assistance with cooking/housework;Assist for transportation        Equipment Recommendations Rolling walker (2 wheels);BSC/3in1 (issued gait belt)  Recommendations for Other Services       Functional Status Assessment Patient has had a recent decline in their functional status and demonstrates the ability to make significant improvements in function in a reasonable and predictable amount of time.     Precautions / Restrictions Precautions Precautions: Fall Restrictions Weight Bearing Restrictions: No      Mobility  Bed Mobility Overal bed mobility: Needs Assistance Bed Mobility: Supine to Sit, Sit to Supine     Supine to sit: Min guard Sit to supine: Min guard   General bed mobility comments: Needed to use her UEs to assist wtih LEs back into bed, but was able to get LEs back into bed with her UE assist.    Transfers Overall transfer level: Needs assistance Equipment used: Rolling walker (2  wheels) Transfers: Sit to/from Stand Sit to Stand: Min guard           General transfer comment: Cues needed for hand placement    Ambulation/Gait Ambulation/Gait assistance: Min guard Gait Distance (Feet): 110 Feet Assistive device: Rolling walker (2 wheels) Gait Pattern/deviations: Decreased stride length, Decreased step length - left, Decreased stance time - left, Decreased weight shift to left   Gait velocity interpretation: <1.31 ft/sec, indicative of household ambulator   General Gait Details: Pt initially having difficulty moving left LE moreso than right LE.  As pt cued for sequencing, pt ambulating better and better.  Pt reports pain but it eased the more she walked using RW to take weight off of LEs.  Stairs Stairs: Yes       General stair comments: Discussed verbally how to ascend and descend a curb  Wheelchair Mobility     Tilt Bed    Modified Rankin (Stroke Patients Only)       Balance Overall balance assessment: Needs assistance Sitting-balance support: No upper extremity supported, Feet supported Sitting balance-Leahy Scale: Fair     Standing balance support: Bilateral upper extremity supported, During functional activity Standing balance-Leahy Scale: Poor Standing balance comment: relies on RW for support                             Pertinent Vitals/Pain Pain Assessment Pain Assessment: Faces Faces Pain Scale: Hurts whole lot Pain Location: pelvis, groin, abdomen Pain Descriptors / Indicators: Aching, Burning,  Grimacing, Guarding, Discomfort Pain Intervention(s): Limited activity within patient's tolerance, Monitored during session, Repositioned    Home Living Family/patient expects to be discharged to:: Private residence Living Arrangements: Parent;Children (mom and 47 year old, new infant) Available Help at Discharge: Available 24 hours/day (5 y.o mother can assist supervision; Father of baby doesnt live in the home and will  come and go) Type of Home: House Home Access: Stairs to enter Entrance Stairs-Rails: None Entrance Stairs-Number of Steps: 1+1 Alternate Level Stairs-Number of Steps: flight Home Layout: Two level;Able to live on main level with bedroom/bathroom (16 yo daughter will be upstairs) Home Equipment: None      Prior Function Prior Level of Function : Independent/Modified Independent;Driving                     Hand Dominance   Dominant Hand: Right    Extremity/Trunk Assessment   Upper Extremity Assessment Upper Extremity Assessment: Defer to OT evaluation    Lower Extremity Assessment Lower Extremity Assessment: Generalized weakness    Cervical / Trunk Assessment Cervical / Trunk Assessment: Normal  Communication   Communication: No difficulties  Cognition Arousal/Alertness: Awake/alert Behavior During Therapy: WFL for tasks assessed/performed Overall Cognitive Status: Within Functional Limits for tasks assessed                                          General Comments General comments (skin integrity, edema, etc.): VSS. Discussed car transfer and how to perform for decr pelvic pain.    Exercises     Assessment/Plan    PT Assessment Patient needs continued PT services  PT Problem List Decreased activity tolerance;Decreased balance;Decreased knowledge of use of DME;Decreased mobility;Decreased safety awareness;Decreased knowledge of precautions;Pain       PT Treatment Interventions DME instruction;Gait training;Functional mobility training;Therapeutic activities;Therapeutic exercise;Balance training;Patient/family education;Stair training    PT Goals (Current goals can be found in the Care Plan section)  Acute Rehab PT Goals Patient Stated Goal: to go home in am PT Goal Formulation: With patient Time For Goal Achievement: 08/13/22 Potential to Achieve Goals: Good    Frequency Min 3X/week     Co-evaluation               AM-PAC PT  "6 Clicks" Mobility  Outcome Measure Help needed turning from your back to your side while in a flat bed without using bedrails?: None Help needed moving from lying on your back to sitting on the side of a flat bed without using bedrails?: A Little Help needed moving to and from a bed to a chair (including a wheelchair)?: A Little Help needed standing up from a chair using your arms (e.g., wheelchair or bedside chair)?: A Little Help needed to walk in hospital room?: A Little Help needed climbing 3-5 steps with a railing? : A Little 6 Click Score: 19    End of Session Equipment Utilized During Treatment: Gait belt Activity Tolerance: Patient limited by fatigue;Patient limited by pain Patient left: in bed;with call bell/phone within reach;with family/visitor present;with nursing/sitter in room Nurse Communication: Mobility status (pt informed this PT that father of baby doesnt live in her home and will not be there all the time.) PT Visit Diagnosis: Muscle weakness (generalized) (M62.81)    Time: 1610-9604 PT Time Calculation (min) (ACUTE ONLY): 41 min   Charges:   PT Evaluation $PT Eval Moderate Complexity: 1 Mod PT  Treatments $Gait Training: 8-22 mins $Self Care/Home Management: 8-22 PT General Charges $$ ACUTE PT VISIT: 1 Visit         Shatonya Passon M,PT Acute Rehab Services [phone]   Bevelyn Buckles 08/06/2022, 2:29 PM

## 2022-08-06 NOTE — Progress Notes (Addendum)
CSW acknowledges consult and completed clinical assessment. Clinical documentation will follow.  There are no barriers to d/c.  Signed,  Norberto Sorenson, MSW, LCSWA, LCASA 08/06/2022    04:30PM
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
## 2022-08-07 MED ORDER — NIFEDIPINE ER 30 MG PO TB24: 30.0000 mg | ORAL_TABLET | Freq: Every day | ORAL | 0 refills | Status: AC

## 2022-08-07 NOTE — Lactation Note (Signed)
This note was copied from a baby's chart. Lactation Consultation Note  Patient Name: Girl Makaylee Moniz [id number] Date: 08/07/2022 Age:42 hours Reason for consult: Follow-up assessment;Term;Other (Comment) (WL 7%)  Visited with family of 17 hours old FT female; Ms. Adolf is a P2 and has been exclusively breastfeeding since birth. She was set up with a DEBP but hasn't been pumping consistently due to baby cluster feeding. She reports breastfeeding is going much better now after she saw [facility] yesterday, she's able to latch baby with ease and feedings at the breast are comfortable. Couplet is going home today. Reviewed discharge education, engorgement prevention/treatment, pump settings and the importance of consistent pumping after feedings at the breast to protect her supply. She voiced she'd like to have a "stash" of breastmilk available for other caregivers; we discussed about pumping/not pumping while baby is cluster feeding. She politely declined an referral to [facility] OP but she's aware Renee Rival is available for LC OP F/U. No other support person at this time. All questions and concerns answered, family to contact [facility] services PRN.  Feeding Mother's Current Feeding Choice: Breast Milk  Lactation Tools Discussed/Used Tools: Pump;Flanges Flange Size: 21 Breast pump type: Double-Electric Breast Pump Pump Education: Setup, frequency, and cleaning;Milk Storage Reason for Pumping: PPH of 1038 cc Pumping frequency: 2 times/24 hours Pumped volume: 1 mL (1-3 ml)  Interventions Interventions: Breast feeding basics reviewed;DEBP;Education  Discharge Discharge Education: Engorgement and breast care;Warning signs for feeding baby;Outpatient recommendation Pump: Personal (Wireless pump at home)  Consult Status Consult Status: Complete Date: 08/07/22 Follow-up type: In-patient   Etan Vasudevan Venetia Constable 08/07/2022, 1:12 PM
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
## 2022-08-07 NOTE — Discharge Summary (Signed)
Postpartum Discharge Summary    Patient Name: Stephanie Schroeder DOB: 1980-12-15 MRN: [id number]  Date of admission: 08/04/2022 Delivery date:08/04/2022 Delivering provider: Carlisle Cater Date of discharge: 08/07/2022  Admitting diagnosis: [redacted] weeks gestation of pregnancy [Z3A.39] labor with new onset severe preeclampsia Intrauterine pregnancy: [redacted]w[redacted]d     Secondary diagnosis:  Principal Problem:   [redacted] weeks gestation of pregnancy  Additional problems: bipolar, depression, anxiety    Discharge diagnosis: Preeclampsia (severe)                                              Post partum procedures: none Augmentation: AROM, pitocin Complications: None  Hospital course: Onset of Labor With Vaginal Delivery      42 y.o. yo G2P2002 at [redacted]w[redacted]d was admitted in Active Labor on 08/04/2022. Labor course was complicated by severe range blood pressures.  Membrane Rupture Time/Date: 8:56 AM,08/04/2022  Delivery Method:Vaginal, Spontaneous Episiotomy: None Lacerations:  1st degree Patient had a postpartum course complicated by reduced mobility requiring consultation with Physical Therapy.  She was prescribed walker and bedside commode and recommended to est care outpt for ongoing PT.  She is ambulating, tolerating a regular diet, passing flatus, and urinating well. Patient is discharged home in stable condition on 08/07/22.  Newborn Data: Birth date:08/04/2022 Birth time:3:02 PM Gender:Female Living status:Living Apgars:8 ,9  Weight:3530 g  Magnesium Sulfate received: Yes: Seizure prophylaxis BMZ received: No Rhophylac:N/A Transfusion:No  Physical exam  Vitals:   08/06/22 2043 08/06/22 2329 08/07/22 0415 08/07/22 0737  BP: (!) 154/82 (!) 145/73 (!) 152/78 130/79  Pulse: 91 80 82 82  Resp: 20 20 19 20   Temp: 98 F (36.7 C) 98 F (36.7 C) 98.3 F (36.8 C) 98.3 F (36.8 C)  TempSrc: Oral Oral Oral Oral  SpO2:   98% 97%  Weight:      Height:       General: alert, cooperative, and no  distress Lochia: appropriate Uterine Fundus: firm Incision: N/A DVT Evaluation: No evidence of DVT seen on physical exam. Labs: Lab Results  Component Value Date   WBC 28.3 (H) 08/05/2022   HGB 10.6 (L) 08/05/2022   HCT 31.5 (L) 08/05/2022   MCV 96.6 08/05/2022   PLT 253 08/05/2022      Latest Ref Rng & Units 08/04/2022    1:17 AM  CMP  Glucose 70 - 99 mg/dL 92   BUN 6 - 20 mg/dL 7   Creatinine 8.65 - 7.84 mg/dL 6.96   Sodium 295 - 284 mmol/L 133   Potassium 3.5 - 5.1 mmol/L 3.6   Chloride 98 - 111 mmol/L 108   CO2 22 - 32 mmol/L 17   Calcium 8.9 - 10.3 mg/dL 9.0   Total Protein 6.5 - 8.1 g/dL 5.8   Total Bilirubin 0.3 - 1.2 mg/dL 0.4   Alkaline Phos 38 - 126 U/L 114   AST 15 - 41 U/L 18   ALT 0 - 44 U/L 14    Edinburgh Score:    08/05/2022   12:43 PM  Edinburgh Postnatal Depression Scale Screening Tool  I have been able to laugh and see the funny side of things. 0  I have looked forward with enjoyment to things. 0  I have blamed myself unnecessarily when things went wrong. 1  I have been anxious or worried for no good reason. 1  I  have felt scared or panicky for no good reason. 0  Things have been getting on top of me. 1  I have been so unhappy that I have had difficulty sleeping. 0  I have felt sad or miserable. 0  I have been so unhappy that I have been crying. 1  The thought of harming myself has occurred to me. 0  Edinburgh Postnatal Depression Scale Total 4      After visit meds:  Allergies as of 08/07/2022       Reactions   Risperidone And Related Swelling   Tongue swelling and throat over 30 years ago   Hydrocodone Nausea And Vomiting        Medication List     TAKE these medications    calcium carbonate 1500 (600 Ca) MG Tabs tablet Commonly known as: OSCAL Take 600 mg of elemental calcium by mouth daily with breakfast.   cetirizine 10 MG tablet Commonly known as: ZYRTEC Take 10 mg by mouth daily as needed for allergies.    cyanocobalamin 100 MCG tablet Commonly known as: VITAMIN B12 Take 100 mcg by mouth daily.   folic acid 1 MG tablet Commonly known as: FOLVITE Take 5 mg by mouth daily.   MULTIVITAMIN GUMMIES WOMENS PO Take 2 tablets by mouth daily.   NIFEdipine 30 MG 24 hr tablet Commonly known as: ADALAT CC Take 1 tablet (30 mg total) by mouth daily.               Durable Medical Equipment  (From admission, onward)           Start     Ordered   08/06/22 1307  For home use only DME Walker rolling  Once       Question Answer Comment  Walker: With 5 Inch Wheels   Patient needs a walker to treat with the following condition Mobility impaired      08/06/22 1306   08/06/22 1306  For home use only DME Bedside commode  Once       Comments: 3-in-1 bedside commode for at home use Per PT recommendation  Question:  Patient needs a bedside commode to treat with the following condition  Answer:  Mobility impaired   08/06/22 1306             Discharge home in stable condition  Infant Disposition:home with mother Discharge instruction: per After Visit Summary and Postpartum booklet. Activity: Advance as tolerated. Pelvic rest for 6 weeks.  Diet: routine diet Anticipated Birth Control: Unsure Postpartum Appointment: within 5 days of discharge Additional Postpartum F/U: Postpartum Depression checkup and BP check. Future Appointments:No future appointments. Follow up Visit:  Follow-up Information     [facility]. Call.   Why: Schedule appt to be seen within 5 days of discharge. Contact information: [address]  [address] [place] [address] [phone]                     08/07/2022 Philip Aspen, DO

## 2022-08-07 NOTE — Progress Notes (Signed)
Physical Therapy Treatment Patient Details Name: Stephanie Schroeder MRN: [id number] DOB: [birth year] Today's Date: 08/07/2022   History of Present Illness Stephanie Schroeder is a 42 y.o. female who presented  for L and D.  Also for tubal ligation.  PMH:  Bipolar, depression, anxiety, cholecystectomy    PT Comments  Continuing work on functional mobility and activity tolerance;  Session focused on progressing gait, practicing stairs, and answering questions/giving mobility suggestions in prep for home; Overall progressing well, and able ot use the gait belt when needed to assist LLE into and out of the bed; anticipate good progress; discussed getting in and out of a high bed and in and out of a car; Equipment has been delivered; OK for dc home from PT standpoint     Assistance Recommended at Discharge PRN  If plan is discharge home, recommend the following:  Can travel by private vehicle    Assistance with cooking/housework;Assist for transportation      Equipment Recommendations  Rolling walker (2 wheels);BSC/3in1 (delivered to the room)    Recommendations for Other Services       Precautions / Restrictions Precautions Precautions: Fall Precaution Comments: fall risk is present, but minimized with use of RW Restrictions Weight Bearing Restrictions: No     Mobility  Bed Mobility Overal bed mobility: Needs Assistance Bed Mobility: Supine to Sit, Sit to Supine     Supine to sit: Supervision Sit to supine: Supervision   General bed mobility comments: Used belt to assist LLE off the bed and back into bed    Transfers Overall transfer level: Needs assistance Equipment used: Rolling walker (2 wheels) Transfers: Sit to/from Stand Sit to Stand: Supervision           General transfer comment: Cues needed for hand placement    Ambulation/Gait Ambulation/Gait assistance: Min guard, Supervision Gait Distance (Feet): 120 Feet Assistive device: Rolling walker (2 wheels) Gait  Pattern/deviations: Decreased stride length, Decreased step length - left, Decreased stance time - left, Decreased weight shift to left       General Gait Details: Able to advance bil LEs without phsyical assist; tending to use step-to pattern; can progress to step-through with cues, but ste-to seems to be more comfortable at this time   Stairs Stairs: Yes Stairs assistance: Supervision Stair Management: No rails, Step to pattern, Backwards, Forwards, With walker Number of Stairs: 1 (x2) General stair comments: Practiced going up a single step forwards and backwards with a RW; Questions answered; pt alos has a high bed, and can use the backwards technique to step onto a stepstool and get into her bed more easily   Wheelchair Mobility     Tilt Bed    Modified Rankin (Stroke Patients Only)       Balance     Sitting balance-Leahy Scale: Fair       Standing balance-Leahy Scale: Fair                              Cognition Arousal/Alertness: Awake/alert Behavior During Therapy: WFL for tasks assessed/performed Overall Cognitive Status: Within Functional Limits for tasks assessed                                          Exercises      General Comments General comments (skin integrity, edema, etc.): Discussed general rules/guidelines for step  sequence with RW and stairs      Pertinent Vitals/Pain Pain Assessment Pain Assessment: Faces Faces Pain Scale: Hurts a little bit Pain Location: pelvis, groin, abdomen Pain Descriptors / Indicators: Aching, Burning, Grimacing, Guarding, Discomfort Pain Intervention(s): Monitored during session    Home Living                          Prior Function            PT Goals (current goals can now be found in the care plan section) Acute Rehab PT Goals Patient Stated Goal: to go home in am PT Goal Formulation: With patient Time For Goal Achievement: 08/13/22 Potential to Achieve Goals:  Good Progress towards PT goals: Progressing toward goals    Frequency    Min 3X/week      PT Plan Current plan remains appropriate    Co-evaluation              AM-PAC PT "6 Clicks" Mobility   Outcome Measure  Help needed turning from your back to your side while in a flat bed without using bedrails?: None Help needed moving from lying on your back to sitting on the side of a flat bed without using bedrails?: None Help needed moving to and from a bed to a chair (including a wheelchair)?: None Help needed standing up from a chair using your arms (e.g., wheelchair or bedside chair)?: None Help needed to walk in hospital room?: A Little Help needed climbing 3-5 steps with a railing? : A Little 6 Click Score: 22    End of Session Equipment Utilized During Treatment: Gait belt Activity Tolerance: Patient tolerated treatment well Patient left: in bed;with call bell/phone within reach;with family/visitor present Nurse Communication: Mobility status PT Visit Diagnosis: Muscle weakness (generalized) (M62.81);Pain Pain - part of body:  (pelvic floor)     Time: 9604-5409 PT Time Calculation (min) (ACUTE ONLY): 24 min  Charges:    $Gait Training: 23-37 mins PT General Charges $$ ACUTE PT VISIT: 1 Visit                     Van Clines, PT  Acute Rehabilitation Services Office [phone] Secure Chat welcomed    Stephanie Schroeder 08/07/2022, 11:56 AM

## 2022-08-07 NOTE — Progress Notes (Signed)
Discharge instructions and prescriptions given to pt. Discussed post vaginal delivery care, signs and symptoms to report to the MD, upcoming appointments, and meds.Pt verbalizes understanding and has no questions or concerns at this time. Pt discharged home from hospital in stable condition. 

## 2022-08-29 ENCOUNTER — Telehealth (HOSPITAL_COMMUNITY): Payer: Self-pay | Admitting: *Deleted

## 2022-08-29 NOTE — Telephone Encounter (Signed)
Attempted hospital discharge follow-up call. Left message for patient to return RN call with any questions or concerns. Deforest Hoyles, RN, 08/29/22, [phone]

## 2022-09-01 ENCOUNTER — Emergency Department (HOSPITAL_BASED_OUTPATIENT_CLINIC_OR_DEPARTMENT_OTHER)
Admission: EM | Admit: 2022-09-01 | Discharge: 2022-09-02 | Disposition: A | Discharge status: Home / Self Care | Payer: BC Managed Care – PPO | Attending: Emergency Medicine | Admitting: Emergency Medicine

## 2022-09-01 ENCOUNTER — Encounter (HOSPITAL_BASED_OUTPATIENT_CLINIC_OR_DEPARTMENT_OTHER): Payer: Self-pay

## 2022-09-01 ENCOUNTER — Emergency Department (HOSPITAL_BASED_OUTPATIENT_CLINIC_OR_DEPARTMENT_OTHER): Payer: BC Managed Care – PPO | Admitting: Radiology

## 2022-09-01 ENCOUNTER — Emergency Department (HOSPITAL_BASED_OUTPATIENT_CLINIC_OR_DEPARTMENT_OTHER): Payer: BC Managed Care – PPO

## 2022-09-01 ENCOUNTER — Other Ambulatory Visit: Payer: Self-pay

## 2022-09-01 DIAGNOSIS — R002 Palpitations: Secondary | ICD-10-CM

## 2022-09-01 DIAGNOSIS — R6883 Chills (without fever): Secondary | ICD-10-CM | POA: Insufficient documentation

## 2022-09-01 DIAGNOSIS — R42 Dizziness and giddiness: Secondary | ICD-10-CM | POA: Diagnosis not present

## 2022-09-01 DIAGNOSIS — R000 Tachycardia, unspecified: Secondary | ICD-10-CM | POA: Insufficient documentation

## 2022-09-01 DIAGNOSIS — D72829 Elevated white blood cell count, unspecified: Secondary | ICD-10-CM | POA: Diagnosis not present

## 2022-09-01 LAB — CBC WITH DIFFERENTIAL/PLATELET
Abs Immature Granulocytes: 0.04 10*3/uL (ref 0.00–0.07)
Basophils Absolute: 0.1 10*3/uL (ref 0.0–0.1)
Basophils Relative: 1 %
Eosinophils Absolute: 0.3 10*3/uL (ref 0.0–0.5)
Eosinophils Relative: 2 %
HCT: 40.9 % (ref 36.0–46.0)
Hemoglobin: 13.7 g/dL (ref 12.0–15.0)
Immature Granulocytes: 0 %
Lymphocytes Relative: 22 %
Lymphs Abs: 2.7 10*3/uL (ref 0.7–4.0)
MCH: 31.5 pg (ref 26.0–34.0)
MCHC: 33.5 g/dL (ref 30.0–36.0)
MCV: 94 fL (ref 80.0–100.0)
Monocytes Absolute: 0.8 10*3/uL (ref 0.1–1.0)
Monocytes Relative: 7 %
Neutro Abs: 8.6 10*3/uL — ABNORMAL HIGH (ref 1.7–7.7)
Neutrophils Relative %: 68 %
Platelets: 380 10*3/uL (ref 150–400)
RBC: 4.35 MIL/uL (ref 3.87–5.11)
RDW: 12.8 % (ref 11.5–15.5)
WBC: 12.6 10*3/uL — ABNORMAL HIGH (ref 4.0–10.5)
nRBC: 0 % (ref 0.0–0.2)

## 2022-09-01 LAB — BASIC METABOLIC PANEL
Anion gap: 9 (ref 5–15)
BUN: 13 mg/dL (ref 6–20)
CO2: 26 mmol/L (ref 22–32)
Calcium: 9.6 mg/dL (ref 8.9–10.3)
Chloride: 103 mmol/L (ref 98–111)
Creatinine, Ser: 0.75 mg/dL (ref 0.44–1.00)
GFR, Estimated: >60 mL/min (ref >60)
Glucose, Bld: 91 mg/dL (ref 70–99)
Potassium: 3.6 mmol/L (ref 3.5–5.1)
Sodium: 138 mmol/L (ref 135–145)

## 2022-09-01 LAB — TSH: TSH: 2.469 u[IU]/mL (ref 0.350–4.500)

## 2022-09-01 LAB — TROPONIN I (HIGH SENSITIVITY): Troponin I (High Sensitivity): 5 ng/L (ref <18)

## 2022-09-01 LAB — PREGNANCY, URINE: Preg Test, Ur: NEGATIVE

## 2022-09-01 MED ORDER — IOHEXOL 350 MG/ML SOLN
100.0000 mL | Freq: Once | INTRAVENOUS | Status: AC | PRN
  Administered 2022-09-02: 75 mL via INTRAVENOUS

## 2022-09-01 NOTE — ED Provider Notes (Signed)
[place] EMERGENCY DEPARTMENT AT [facility] Provider Note   CSN: [id number] Arrival date & time: 09/01/22  1849     History Chief Complaint  Patient presents with   Tachycardia    Stephanie Schroeder is a 42 y.o. female with h/p pre-eclampsia now with post partum HTN presents to the ER today for evaluations of palpitations.  Currently, the patient reports that she is 4 weeks postpartum.  [id number] tonight, the patient reports that she started feeling palpitations/rapid heart rate.  She reports that she was little lightheaded but did not have any chest pain or any shortness of breath.  Reports that she took her blood pressure and it was 120/67 however the heart rates that it was 140.  She reports that this lasted for around an hour and then slowly subsided.  She denies any fevers or any recent cough or cold symptoms.  Denies any chest pain or shortness of breath.  Denies any nausea, vomiting, diaphoresis.  Reports that she did have some chills afterwards but has not had any since.  Denies any headache trouble walking or any trouble talking.  Denies any visual changes or diplopia.  She denies any syncope.  She had a vaginal delivery on 08-04-2022.  She reports that she had preeclampsia the day she delivered and also lost a liter of blood but did not receive any blood transfusions.  She has been on nifedipine 30 mg and has not had any troubles with her blood pressure while on the medication.  HPI     Home Medications Prior to Admission medications   Medication Sig Start Date End Date Taking? Authorizing Provider  calcium carbonate (OSCAL) 1500 (600 Ca) MG TABS tablet Take 600 mg of elemental calcium by mouth daily with breakfast.    [provider]  cetirizine (ZYRTEC) 10 MG tablet Take 10 mg by mouth daily as needed for allergies.    [provider]  cyanocobalamin (VITAMIN B12) 100 MCG tablet Take 100 mcg by mouth daily.    [provider]  folic acid  (FOLVITE) 1 MG tablet Take 5 mg by mouth daily.    [provider]  Multiple Vitamins-Minerals (MULTIVITAMIN GUMMIES WOMENS PO) Take 2 tablets by mouth daily.    [provider]  NIFEdipine (ADALAT CC) 30 MG 24 hr tablet Take 1 tablet (30 mg total) by mouth daily. 08/07/22   Philip Aspen, DO      Allergies    Risperidone and related and Hydrocodone    Review of Systems   Review of Systems  Constitutional:  Negative for chills and fever.  HENT:  Negative for congestion and rhinorrhea.   Respiratory:  Negative for cough and shortness of breath.   Cardiovascular:  Positive for palpitations. Negative for chest pain and leg swelling.  Gastrointestinal:  Negative for abdominal pain, constipation, diarrhea, nausea and vomiting.  Genitourinary:  Negative for dysuria and hematuria.  Musculoskeletal:  Negative for gait problem.  Neurological:  Positive for light-headedness. Negative for syncope, speech difficulty and headaches.    Physical Exam Updated Vital Signs BP 111/71   Pulse 74   Temp 98.2 F (36.8 C) (Oral)   Resp 13   Ht 5\' 4"  (1.626 m)   Wt 80.7 kg   SpO2 96%   BMI 30.55 kg/m  Physical Exam Vitals and nursing note reviewed.  Constitutional:      General: She is not in acute distress.    Appearance: She is not ill-appearing or toxic-appearing.  HENT:  Mouth/Throat:     Mouth: Mucous membranes are moist.  Eyes:     General: No scleral icterus. Cardiovascular:     Rate and Rhythm: Normal rate.     Pulses: Normal pulses.  Pulmonary:     Effort: Pulmonary effort is normal. No respiratory distress.     Breath sounds: Normal breath sounds.  Abdominal:     Palpations: Abdomen is soft.     Tenderness: There is no abdominal tenderness. There is no guarding or rebound.  Musculoskeletal:     Cervical back: Normal range of motion.     Right lower leg: No edema.     Left lower leg: No edema.  Skin:    General: Skin is warm and dry.  Neurological:      General: No focal deficit present.     Mental Status: She is alert. Mental status is at baseline.     Cranial Nerves: No cranial nerve deficit.     Sensory: No sensory deficit.     Motor: No weakness.     ED Results / Procedures / Treatments   Labs (all labs ordered are listed, but only abnormal results are displayed) Labs Reviewed  CBC WITH DIFFERENTIAL/PLATELET - Abnormal; Notable for the following components:      Result Value   WBC 12.6 (*)    Neutro Abs 8.6 (*)    All other components within normal limits  BASIC METABOLIC PANEL  PREGNANCY, URINE  TSH  TROPONIN I (HIGH SENSITIVITY)  TROPONIN I (HIGH SENSITIVITY)    EKG EKG Interpretation Date/Time:  Thursday September 01 2022 19:02:31 EDT Ventricular Rate:  82 PR Interval:  134 QRS Duration:  86 QT Interval:  350 QTC Calculation: 408 R Axis:   61  Text Interpretation: Normal sinus rhythm T wave abnormality, consider inferior ischemia Abnormal ECG No previous ECGs available twi noted lead 3 , avf, v3 flat t wave in v5/v6 no prior no stemi Confirmed by Tanda Rockers ([id number]) on 09/01/2022 8:12:47 PM  Radiology DG Chest Port 1 View  Result Date: 09/01/2022 CLINICAL DATA:  Palpitations EXAM: PORTABLE CHEST 1 VIEW COMPARISON:  None Available. FINDINGS: Hyperinflation. No consolidation, pneumothorax or effusion. No edema. Normal cardiopericardial silhouette. Overlapping cardiac leads. IMPRESSION: Hyperinflation.  No acute cardiopulmonary disease. Electronically Signed   By: Karen Kays M.D.   On: 09/01/2022 20:55    Procedures Procedures   Medications Ordered in ED Medications - No data to display  ED Course/ Medical Decision Making/ A&P    Medical Decision Making Amount and/or Complexity of Data Reviewed Labs: ordered. Radiology: ordered.  Risk Prescription drug management.   42 y.o. female presents to the ER for evaluation of palpitations. Differential diagnosis includes but is not limited to Cardiac arrhythmias,  ACS, CHF, pericarditis, valvular disease, panic/anxiety, ETOH, stimulant use, medication side effect, anemia, hyperthyroidism, pulmonary embolism. Vital signs unremarkable. Physical exam as noted above.   EKG reviewed and interpreted by my attending and read as Normal sinus rhythm T wave abnormality, consider inferior ischemia Abnormal ECG No previous ECGs available twi noted lead 3 , avf, v3 flat t wave in v5/v6 no prior no stemi.  I independently reviewed and interpreted the patient's labs.  CBC shows slightly elevated white blood cell count 12.6 with a left shift.  Patient where she has a chronically elevated white blood cell count and is usually in the 18s.  No anemia.  Normal platelets.  Pregnancy test is negative.  BMP shows no electrolyte or LFT abnormality.  Troponin at 5, pending delta.  TSH within normal limits.  CXR shows hyperinflation.  No acute cardiopulmonary disease.   CTA shows pending at this time. Will hand off to oncoming shift pending second troponin and CT imaging. Will likely need follow up with cardiology.   12:42 AM Care of Stephanie Schroeder  transferred to Dr. Preston Fleeting at the end of my shift as the patient will require reassessment once labs/imaging have resulted. Patient presentation, ED course, and plan of care discussed with review of all pertinent labs and imaging. Please see his/her note for further details regarding further ED course and disposition. Plan at time of handoff is follow up on second troponin and CTA imaging. This may be altered or completely changed at the discretion of the oncoming team pending results of further workup.  Portions of this report may have been transcribed using voice recognition software. Every effort was made to ensure accuracy; however, inadvertent computerized transcription errors may be present.   Final Clinical Impression(s) / ED Diagnoses Final diagnoses:  None    Rx / DC Orders ED Discharge Orders     None         Achille Rich, PA-C 09/02/22 0045    Sloan Leiter, DO 09/03/22 2251

## 2022-09-01 NOTE — ED Triage Notes (Signed)
Patient arrives with complaints of having an acute episode of elevated heart rate in the 140's. Patient states that her heart rate remained elevated for about one hour.  Patient did have a vaginal delivery 1 month ago. No fever/chills or pain.

## 2022-09-02 ENCOUNTER — Emergency Department (HOSPITAL_BASED_OUTPATIENT_CLINIC_OR_DEPARTMENT_OTHER): Payer: BC Managed Care – PPO

## 2022-09-02 NOTE — ED Provider Notes (Signed)
Care assumed from [facility], patient who is post-partum presented with chest pain. She is pending second troponin and CT angiogram of the chest.  Repeat troponin is normal, CT angiogram of chest shows no evidence of pulmonary embolism or other pathology.  Have independently viewed the images, and agree with the radiologist's interpretation.  She continues to be symptom-free, monitor continues to show normal sinus rhythm.  I feel she is safe for discharge.  She will be given a referral to cardiology for consideration for outpatient cardiac monitoring.  Results for orders placed or performed during the hospital encounter of 09/01/22  CBC with Differential  Result Value Ref Range   WBC 12.6 (H) 4.0 - 10.5 K/uL   RBC 4.35 3.87 - 5.11 MIL/uL   Hemoglobin 13.7 12.0 - 15.0 g/dL   HCT 40.9 81.1 - 91.4 %   MCV 94.0 80.0 - 100.0 fL   MCH 31.5 26.0 - 34.0 pg   MCHC 33.5 30.0 - 36.0 g/dL   RDW 78.2 95.6 - 21.3 %   Platelets 380 150 - 400 K/uL   nRBC 0.0 0.0 - 0.2 %   Neutrophils Relative % 68 %   Neutro Abs 8.6 (H) 1.7 - 7.7 K/uL   Lymphocytes Relative 22 %   Lymphs Abs 2.7 0.7 - 4.0 K/uL   Monocytes Relative 7 %   Monocytes Absolute 0.8 0.1 - 1.0 K/uL   Eosinophils Relative 2 %   Eosinophils Absolute 0.3 0.0 - 0.5 K/uL   Basophils Relative 1 %   Basophils Absolute 0.1 0.0 - 0.1 K/uL   Immature Granulocytes 0 %   Abs Immature Granulocytes 0.04 0.00 - 0.07 K/uL  Basic metabolic panel  Result Value Ref Range   Sodium 138 135 - 145 mmol/L   Potassium 3.6 3.5 - 5.1 mmol/L   Chloride 103 98 - 111 mmol/L   CO2 26 22 - 32 mmol/L   Glucose, Bld 91 70 - 99 mg/dL   BUN 13 6 - 20 mg/dL   Creatinine, Ser 0.86 0.44 - 1.00 mg/dL   Calcium 9.6 8.9 - 57.8 mg/dL   GFR, Estimated >46 >96 mL/min   Anion gap 9 5 - 15  Pregnancy, urine  Result Value Ref Range   Preg Test, Ur NEGATIVE NEGATIVE  TSH  Result Value Ref Range   TSH 2.469 0.350 - 4.500 uIU/mL  Troponin I (High Sensitivity)  Result  Value Ref Range   Troponin I (High Sensitivity) 5 <18 ng/L  Troponin I (High Sensitivity)  Result Value Ref Range   Troponin I (High Sensitivity) 5 <18 ng/L   CT Angio Chest PE W and/or Wo Contrast  Result Date: 09/02/2022 CLINICAL DATA:  Pulmonary embolism (PE) suspected, high prob EXAM: CT ANGIOGRAPHY CHEST WITH CONTRAST TECHNIQUE: Multidetector CT imaging of the chest was performed using the standard protocol during bolus administration of intravenous contrast. Multiplanar CT image reconstructions and MIPs were obtained to evaluate the vascular anatomy. RADIATION DOSE REDUCTION: This exam was performed according to the departmental dose-optimization program which includes automated exposure control, adjustment of the mA and/or kV according to patient size and/or use of iterative reconstruction technique. CONTRAST:  75mL OMNIPAQUE IOHEXOL 350 MG/ML SOLN COMPARISON:  None Available. FINDINGS: Cardiovascular: No filling defects in the pulmonary arteries to suggest pulmonary emboli. Heart is normal size. Aorta is normal caliber. Mediastinum/Nodes: No mediastinal, hilar, or axillary adenopathy. Trachea and esophagus are unremarkable. Thyroid unremarkable. Lungs/Pleura: Bibasilar atelectasis. No confluent opacities or effusions. Upper Abdomen: No acute  findings Musculoskeletal: Chest wall soft tissues are unremarkable. No acute bony abnormality. Review of the MIP images confirms the above findings. IMPRESSION: No evidence of pulmonary embolus. Bibasilar atelectasis. No active disease Electronically Signed   By: Charlett Nose M.D.   On: 09/02/2022 00:22   DG Chest Port 1 View  Result Date: 09/01/2022 CLINICAL DATA:  Palpitations EXAM: PORTABLE CHEST 1 VIEW COMPARISON:  None Available. FINDINGS: Hyperinflation. No consolidation, pneumothorax or effusion. No edema. Normal cardiopericardial silhouette. Overlapping cardiac leads. IMPRESSION: Hyperinflation.  No acute cardiopulmonary disease. Electronically Signed    By: Karen Kays M.D.   On: 09/01/2022 20:55      Dione Booze, MD 09/02/22 0140

## 2022-09-02 NOTE — Discharge Instructions (Addendum)
You were seen here today for evaluation of your palpitations. Please make sure you follow up with cardiology. They should reach out to you in the next few days, however if they do not, please call them to schedule. If you have any concerns, new or worsening symptoms, please return to the nearest ER for re-evaluation.   Contact a doctor if: You keep having fast or uneven heartbeats for a long time. Your symptoms happen more often. Get help right away if: You have chest pain. You feel short of breath. You have a very bad headache. You feel dizzy. You faint. These symptoms may be an emergency. Get help right away. Call your local emergency services (911 in the [place] Do not wait to see if the symptoms will go away. Do not drive yourself to the hospital.

## 2022-09-02 NOTE — ED Notes (Signed)
Discharge instructions discussed with pt. Pt verbalized understanding. Pt stable and ambulatory.  °

## 2022-11-21 ENCOUNTER — Ambulatory Visit: Payer: BC Managed Care – PPO | Admitting: Cardiology
